# Patient Record
Sex: Male | Born: 1950 | Race: White | Hispanic: No | Marital: Single
Health system: Midwestern US, Academic
[De-identification: ages and names within clinical notes are randomized; demographics above are authoritative.]

---

## 2021-07-04 IMAGING — MR Head^Brain
2 series · 48 of 48 positions shown · non-contrast
Comparison: none

[Series 4: cow · axial · 0.8mm · 0.56mm/px · z∈[-71,-0]mm · 46 of 90 slices shown]
[im 1/90]
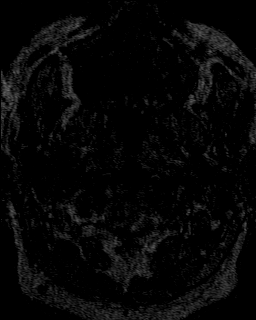
[im 2/90]
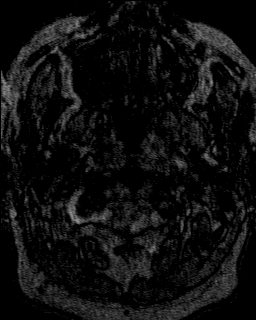
[im 4/90]
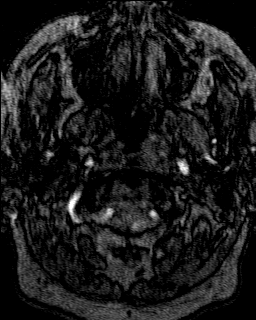
[im 6/90]
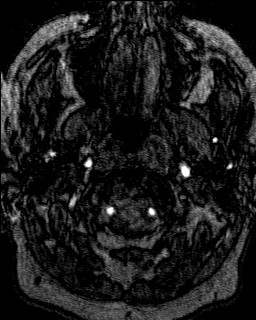
[im 8/90]
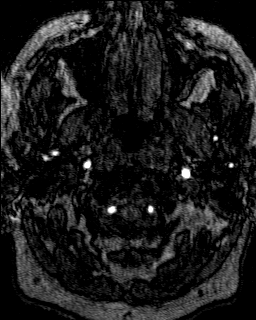
[im 10/90]
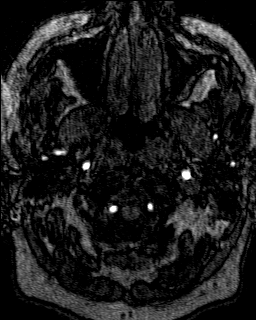
[im 12/90]
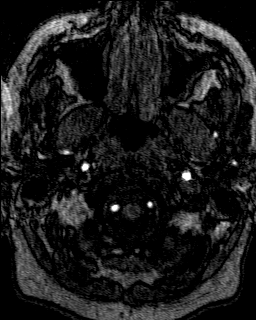
[im 14/90]
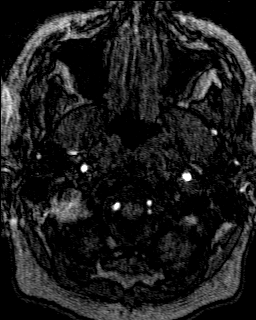
[im 16/90]
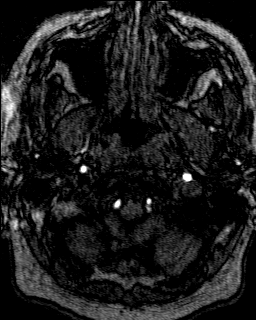
[im 18/90]
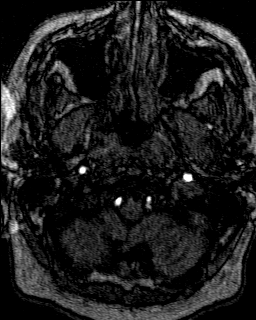
[im 20/90]
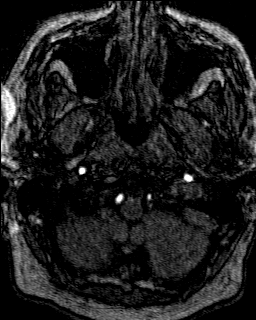
[im 22/90]
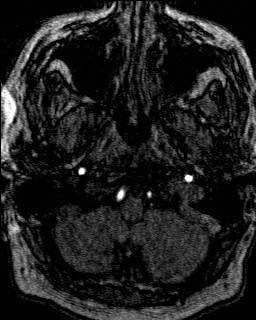
[im 24/90]
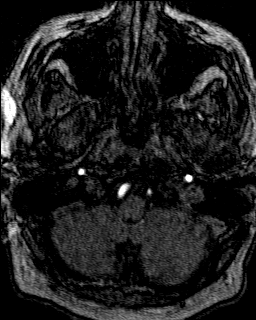
[im 26/90]
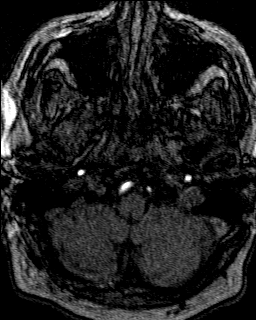
[im 28/90]
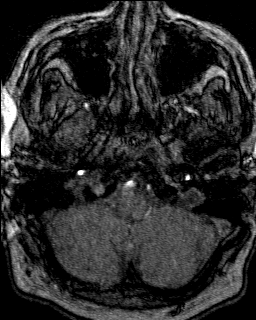
[im 30/90]
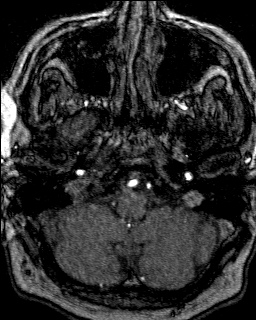
[im 32/90]
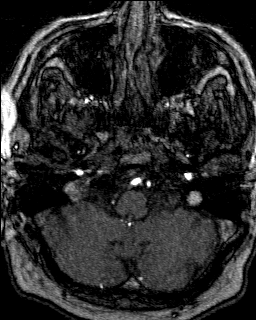
[im 34/90]
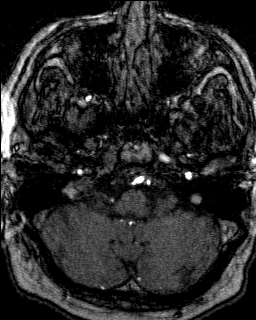
[im 36/90]
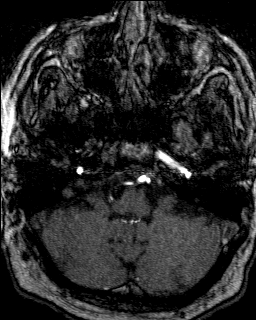
[im 38/90]
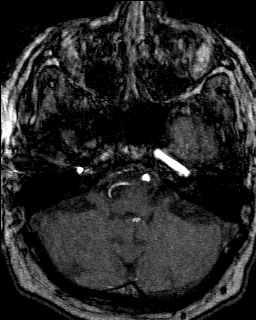
[im 40/90]
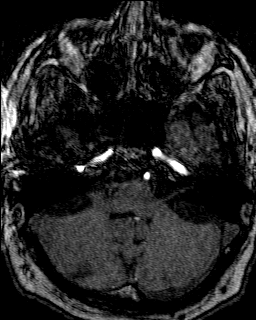
[im 42/90]
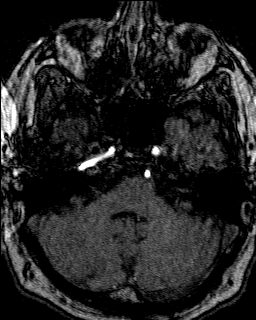
[im 44/90]
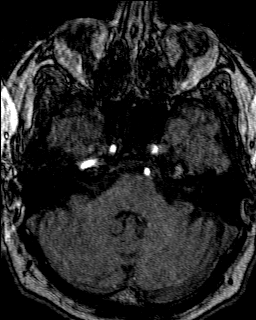
[im 46/90]
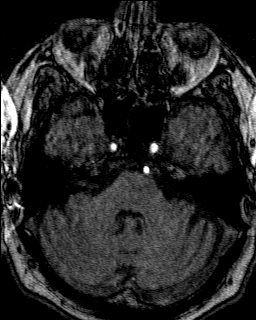
[im 48/90]
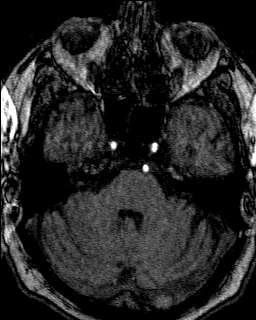
[im 50/90]
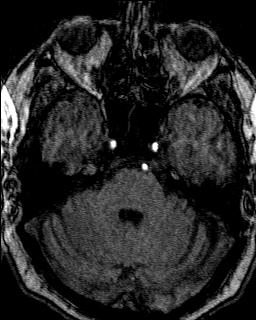
[im 52/90]
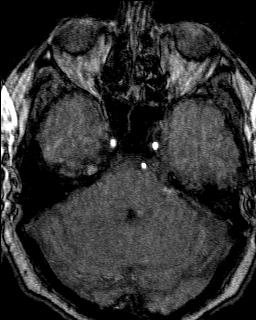
[im 54/90]
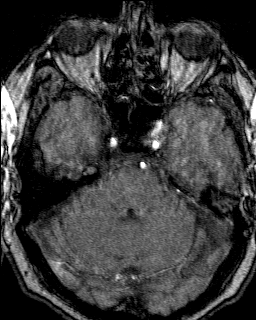
[im 56/90]
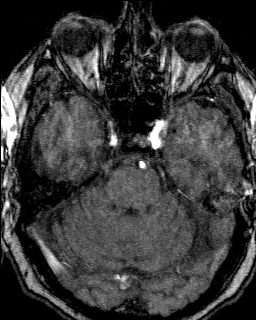
[im 58/90]
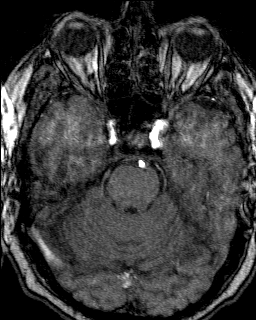
[im 60/90]
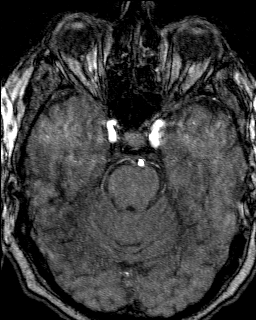
[im 62/90]
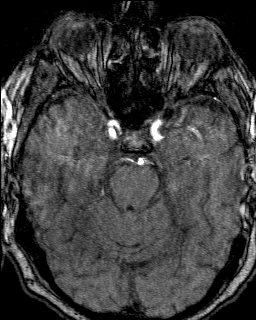
[im 64/90]
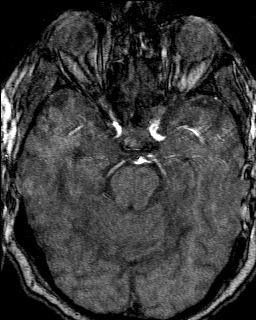
[im 66/90]
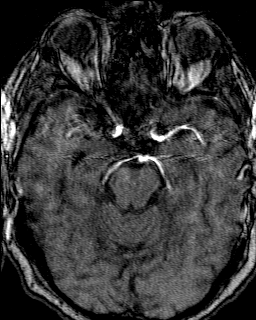
[im 68/90]
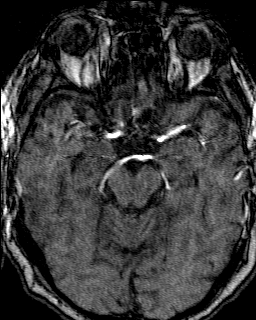
[im 70/90]
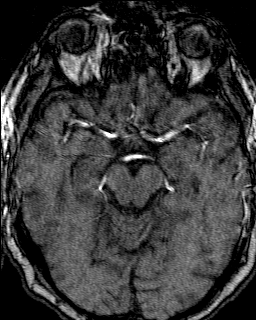
[im 72/90]
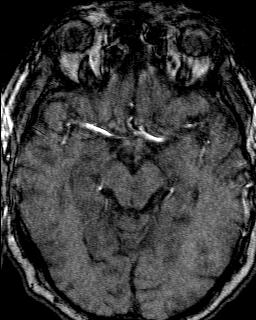
[im 74/90]
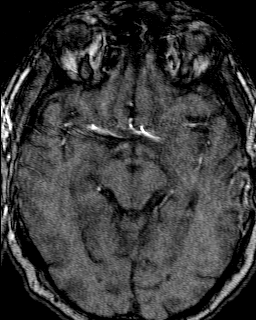
[im 76/90]
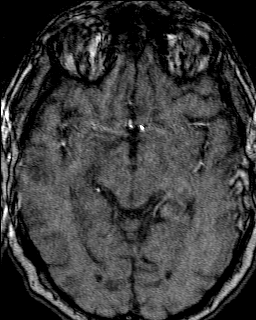
[im 78/90]
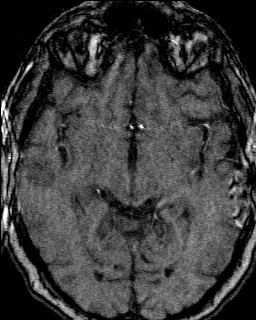
[im 80/90]
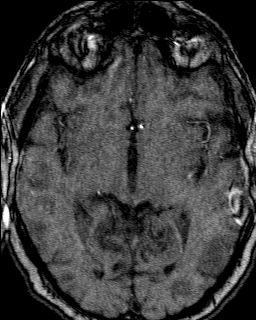
[im 82/90]
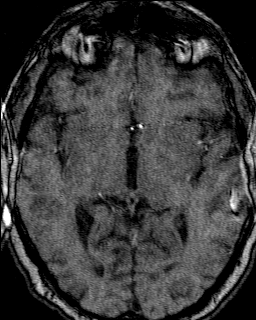
[im 84/90]
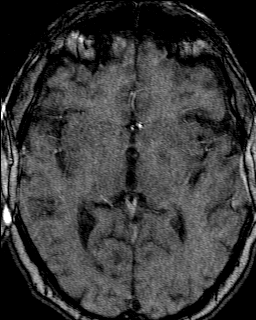
[im 86/90]
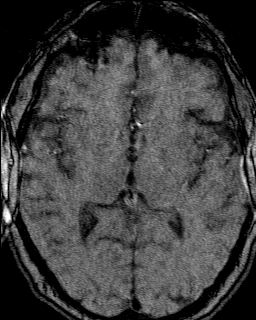
[im 88/90]
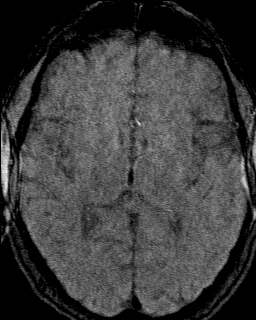
[im 90/90]
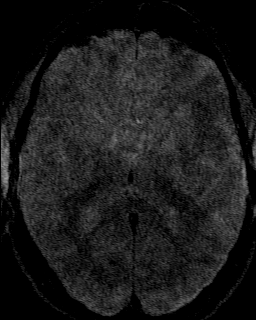

[Series 11: 180 tumble h · axial · 0.56mm/px · z∈[-78,-33]mm · 2 of 3 slices shown]
[im 1/3]
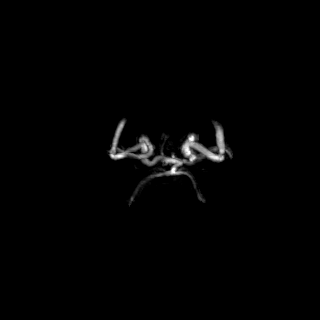
[im 3/3]
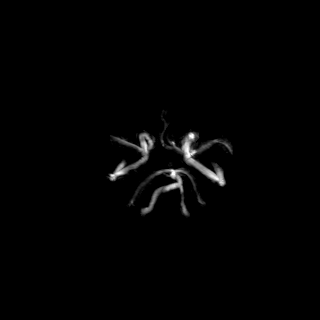

[48 of 48 positions shown; findings below may reference images not displayed]

07/04/21

DIAGNOSTIC STUDIES

EXAM

MRI of the brain without contrast MR angiography of the brain.

INDICATION

R sided weakness
RIGHT SIDED WEAKNESS, FACIAL NUMBNESS, DIFFICULTY GETTING WORDS OUT, UNABLE TO WRITE WITH RT HAND,
STARTING YESTERDAY.  PT HAD DIFFICULTY HOLDING STILL
DUE TO PAIN AND "CRAMPING IN LEGS", TRIED WALKING PATIENT, REPOSITIONI(..)

TECHNIQUE

Sagittal, axial, and coronal images were obtained with variable T1 and T2 weighting. 3D
time-of-flight MR angiography through the circle Willis was obtained.

COMPARISONS

None available

FINDINGS

There are no abnormal signal properties of the brainstem or visualized cervical cord. The expected
signal voids are noted throughout the major intracranial vascular structures and mastoid air cells.
Mucosal thickening of the paranasal sinuses is evident.

There is cortical atrophy and small vessel ischemic disease with scattered foci of T2 signal within
the supratentorial white matter. There is a focus of diffusion restriction left pons measuring
by 0.8 cm image 49 series 5 with corresponding ADC match consistent with acute ischemia. No
intracranial mass or hemorrhage is noted.

Exam is slightly limited due to patient motion.

3D time-of-flight demonstrates normal flow throughout the vertebral bodies are system with dominant
right vertebral artery. Normal flow in the cavernous arteries is seen. No aneurysm or vascular
malformation is noted. The posterior communicating arteries are not visualized and may be
developmentally absent.

IMPRESSION

Acute left pontine infarct.

No intracranial mass or hemorrhage. Underlying cortical atrophy and small vessel ischemic disease
is evident. Report called to Dr. Sungja at [DATE] p.m..

Tech Notes:

RIGHT SIDED WEAKNESS, FACIAL NUMBNESS, DIFFICULTY GETTING WORDS OUT, UNABLE TO WRITE WITH RT HAND,
STARTING YESTERDAY.  PT HAD DIFFICULTY HOLDING STILL DUE TO PAIN AND "CRAMPING IN LEGS", TRIED
WALKING PATIENT, REPOSITIONING, RESTRAINING, AND RESTING PATIENT, ALL WITH DISCOMFORT AND MOTION.
RG

## 2021-07-04 IMAGING — MR Head^Brain
8 series · 38 of 48 positions shown · non-contrast
Comparison: none

[Series 4: T1 · sagittal · 5.0mm · 0.45mm/px · 4 of 20 slices shown (1 of 2)]
[im 1/20]
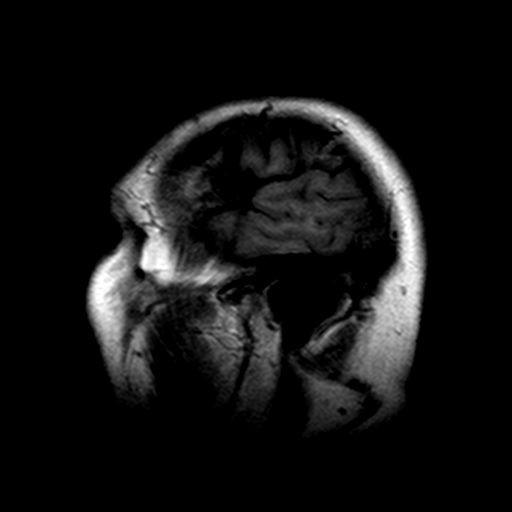
[im 7/20]
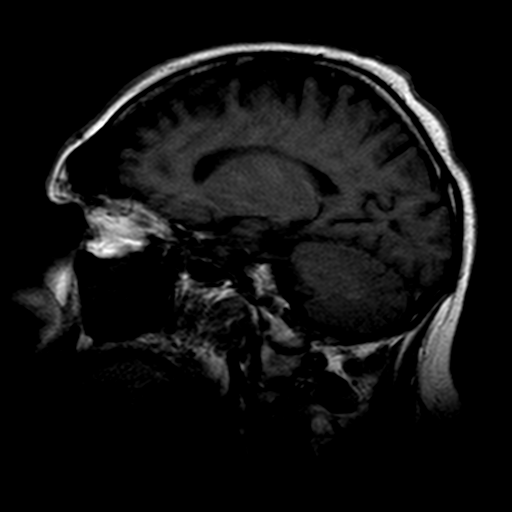
[im 13/20]
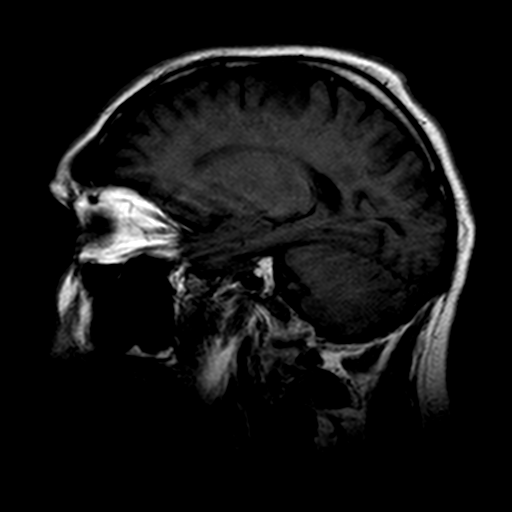
[im 20/20]
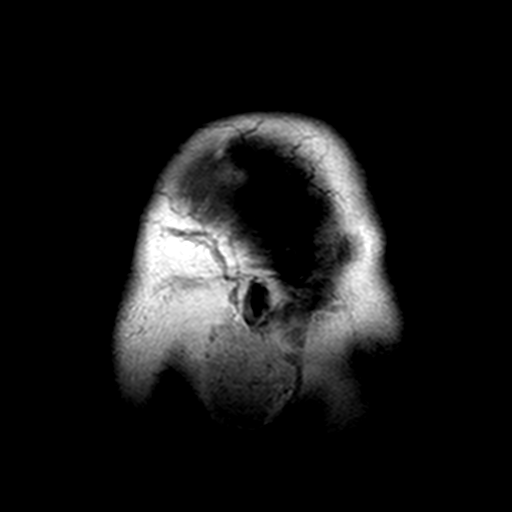

[Series 5: DWI · axial · 5.0mm · 1.80mm/px · z∈[-68,+60]mm · 8 of 63 slices shown (1 of 2)]
[im 1/63]
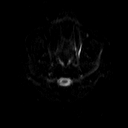
[im 10/63]
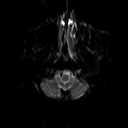
[im 20/63]
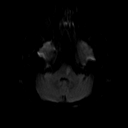
[im 29/63]
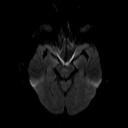
[im 34/63]
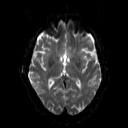
[im 43/63]
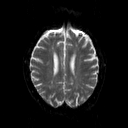
[im 53/63]
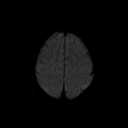
[im 63/63]
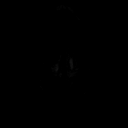

[Series 6: DWI · axial · 5.0mm · 1.80mm/px · z∈[-68,+60]mm · 5 of 21 slices shown (2 of 2)]
[im 1/21]
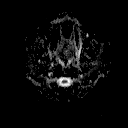
[im 6/21]
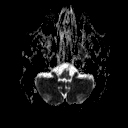
[im 11/21]
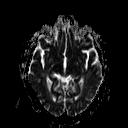
[im 16/21]
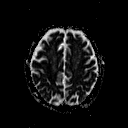
[im 21/21]
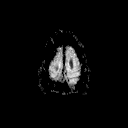

[Series 11: FLAIR · axial · 5.0mm · 0.45mm/px · z∈[-67,+79]mm · 5 of 24 slices shown]
[im 1/24]
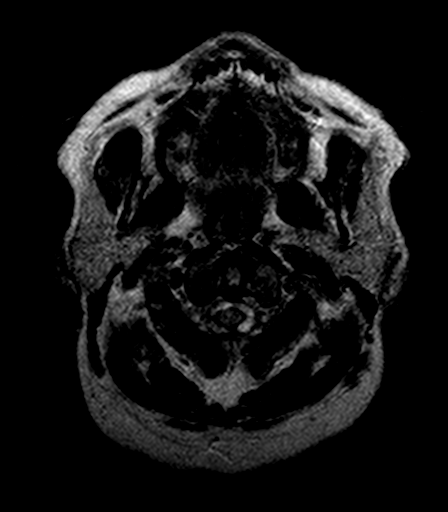
[im 6/24]
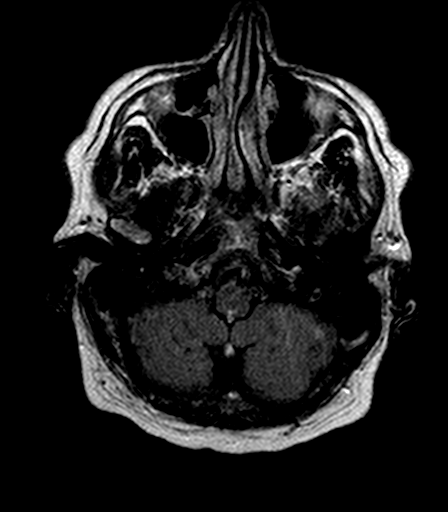
[im 12/24]
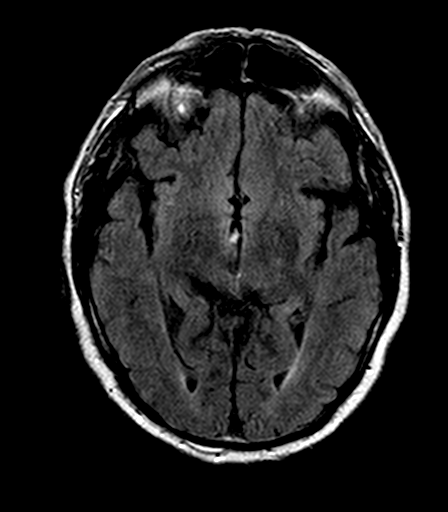
[im 18/24]
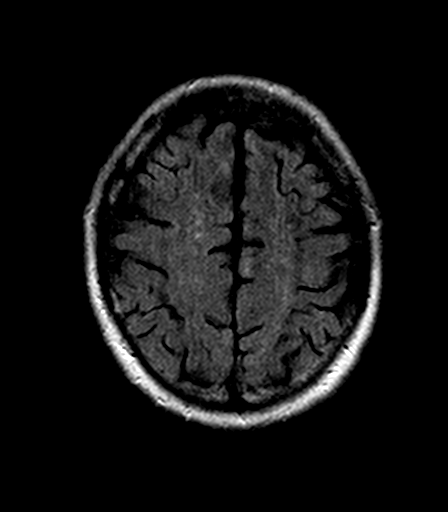
[im 24/24]
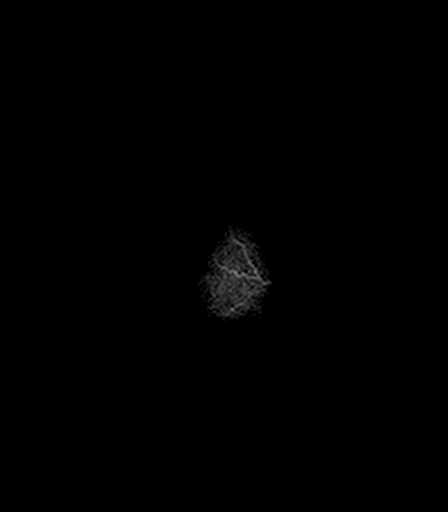

[Series 12: T2 · axial · 5.0mm · 0.72mm/px · z∈[-67,+79]mm · 5 of 24 slices shown (1 of 2)]
[im 1/24]
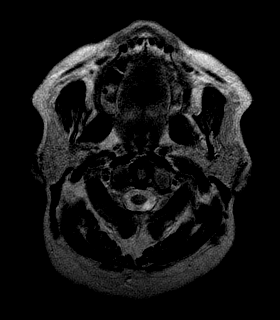
[im 6/24]
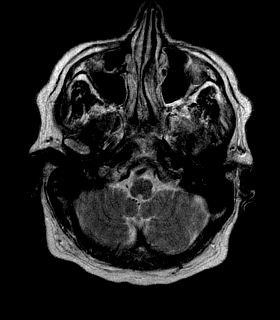
[im 12/24]
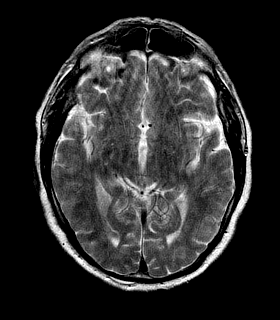
[im 18/24]
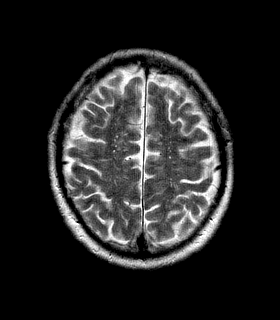
[im 24/24]
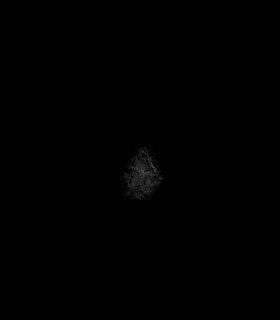

[Series 13: T1 · axial · 5.0mm · 0.45mm/px · z∈[-67,+79]mm · 5 of 23 slices shown (2 of 2)]
[im 1/23]
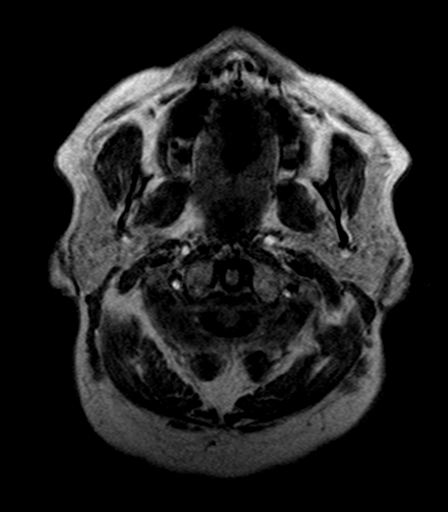
[im 6/23]
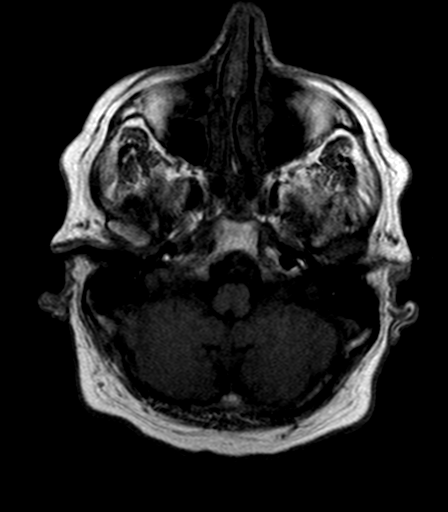
[im 12/23]
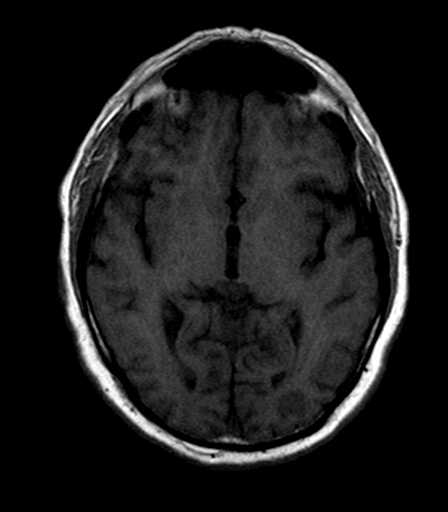
[im 17/23]
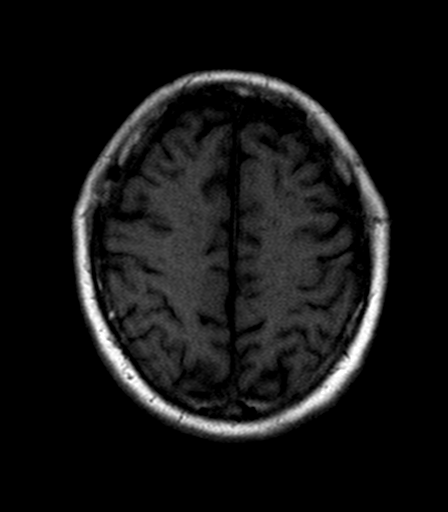
[im 23/23]
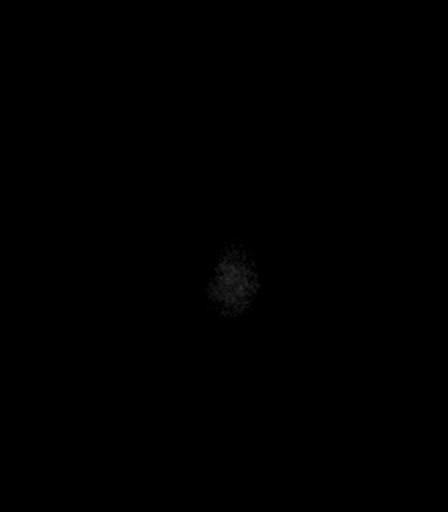

[Series 14: axial blood · axial · 5.0mm · 0.45mm/px · 1 of 23 slices shown]
[im 1/23]
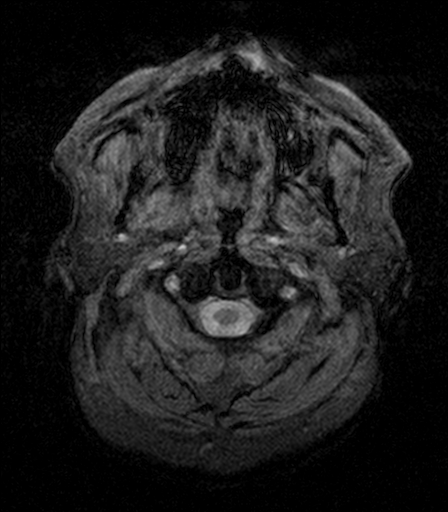

[Series 15: T2 · coronal · 5.0mm · 0.69mm/px · 5 of 25 slices shown (2 of 2)]
[im 1/25]
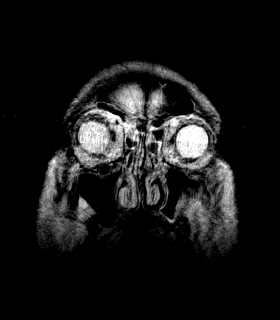
[im 7/25]
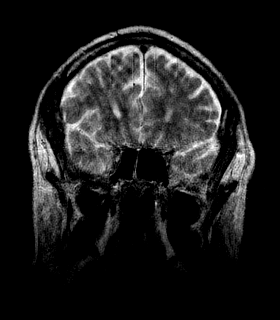
[im 13/25]
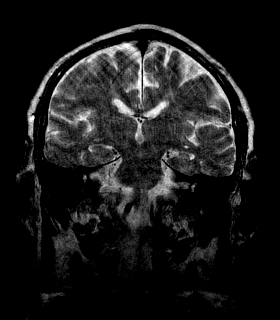
[im 19/25]
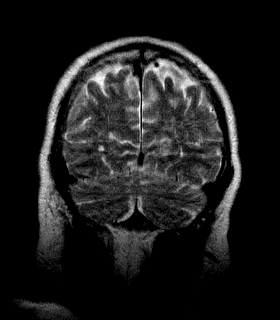
[im 25/25]
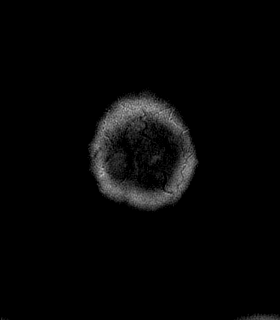

[38 of 48 positions shown; findings below may reference images not displayed]

07/04/21

DIAGNOSTIC STUDIES

EXAM

MRI of the brain without contrast MR angiography of the brain.

INDICATION

R sided weakness
RIGHT SIDED WEAKNESS, FACIAL NUMBNESS, DIFFICULTY GETTING WORDS OUT, UNABLE TO WRITE WITH RT HAND,
STARTING YESTERDAY.  PT HAD DIFFICULTY HOLDING STILL
DUE TO PAIN AND "CRAMPING IN LEGS", TRIED WALKING PATIENT, REPOSITIONI(..)

TECHNIQUE

Sagittal, axial, and coronal images were obtained with variable T1 and T2 weighting. 3D
time-of-flight MR angiography through the circle Willis was obtained.

COMPARISONS

None available

FINDINGS

There are no abnormal signal properties of the brainstem or visualized cervical cord. The expected
signal voids are noted throughout the major intracranial vascular structures and mastoid air cells.
Mucosal thickening of the paranasal sinuses is evident.

There is cortical atrophy and small vessel ischemic disease with scattered foci of T2 signal within
the supratentorial white matter. There is a focus of diffusion restriction left pons measuring
by 0.8 cm image 49 series 5 with corresponding ADC match consistent with acute ischemia. No
intracranial mass or hemorrhage is noted.

Exam is slightly limited due to patient motion.

3D time-of-flight demonstrates normal flow throughout the vertebral bodies are system with dominant
right vertebral artery. Normal flow in the cavernous arteries is seen. No aneurysm or vascular
malformation is noted. The posterior communicating arteries are not visualized and may be
developmentally absent.

IMPRESSION

Acute left pontine infarct.

No intracranial mass or hemorrhage. Underlying cortical atrophy and small vessel ischemic disease
is evident. Report called to Dr. Sungja at [DATE] p.m..

Tech Notes:

RIGHT SIDED WEAKNESS, FACIAL NUMBNESS, DIFFICULTY GETTING WORDS OUT, UNABLE TO WRITE WITH RT HAND,
STARTING YESTERDAY.  PT HAD DIFFICULTY HOLDING STILL DUE TO PAIN AND "CRAMPING IN LEGS", TRIED
WALKING PATIENT, REPOSITIONING, RESTRAINING, AND RESTING PATIENT, ALL WITH DISCOMFORT AND MOTION.
RG

## 2021-07-04 IMAGING — CT BRAIN WO(Adult)
2 of 4 series · 13 of 47 positions shown, 16 images · non-contrast
Comparison: none

[Series 4: brain cor 5.00 hr40 s3 · coronal · 0.28mm/px · 3 of 41 slices shown]
[im 14/41  brain]
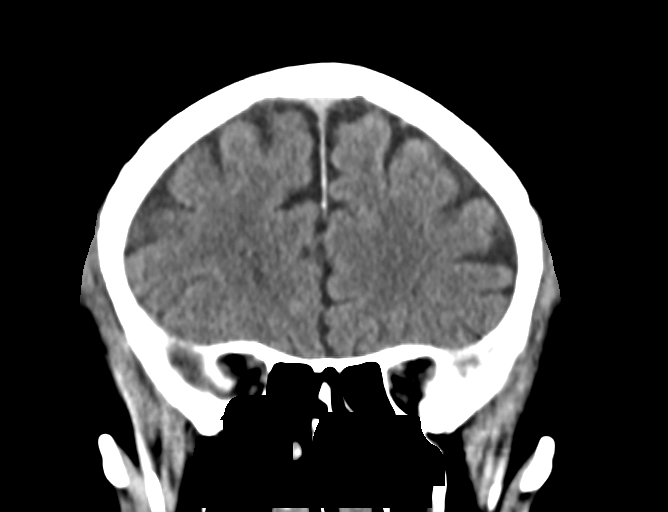
[im 18/41  brain]
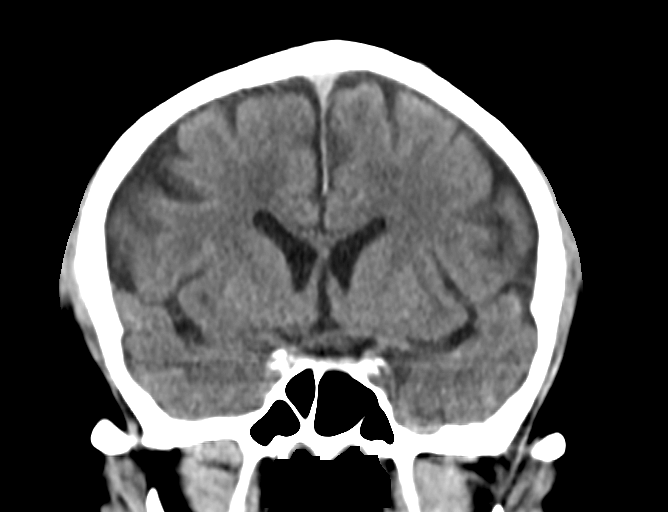
[im 23/41  brain]
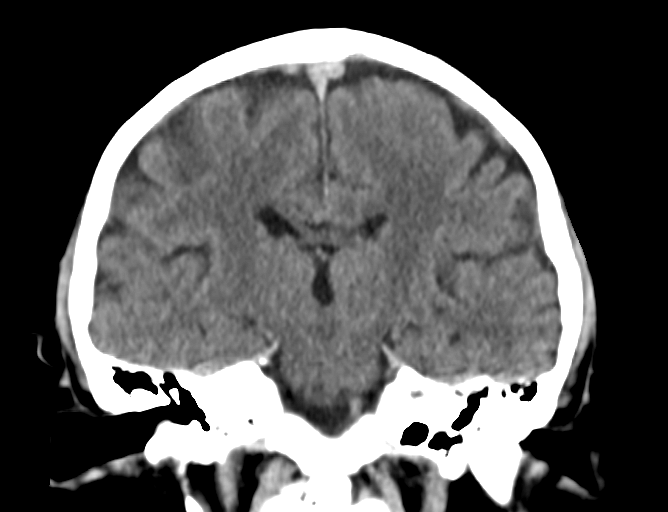

[Series 8: brain ax 2.00 hr60 s3 · axial · 0.36mm/px · z∈[-523,-409]mm · 10 of 71 slices shown, 13 images]
[im 7/71  brain]
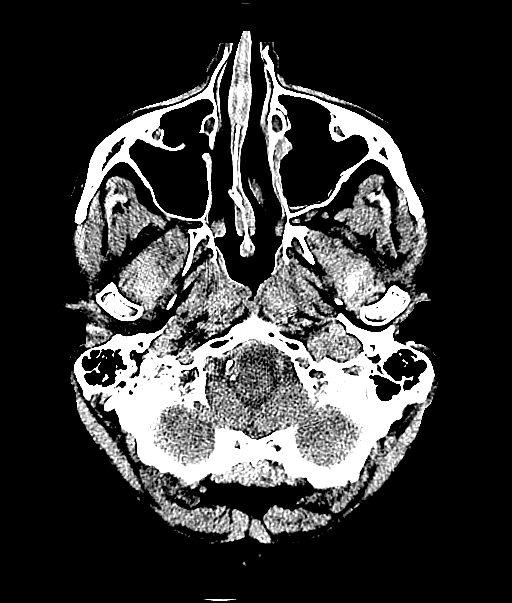
[im 7/71  bone]
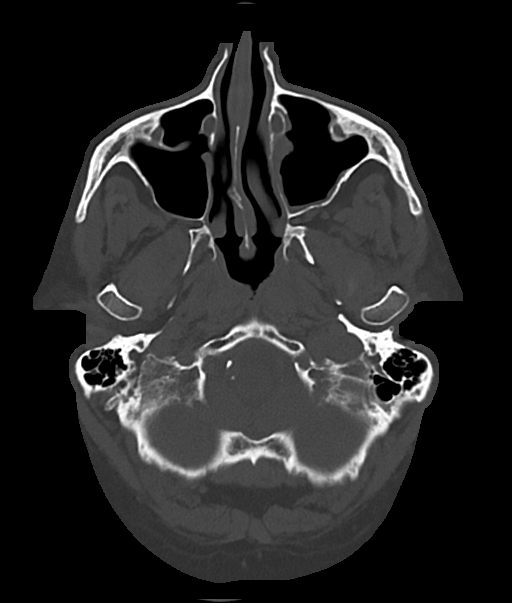
[im 13/71  brain]
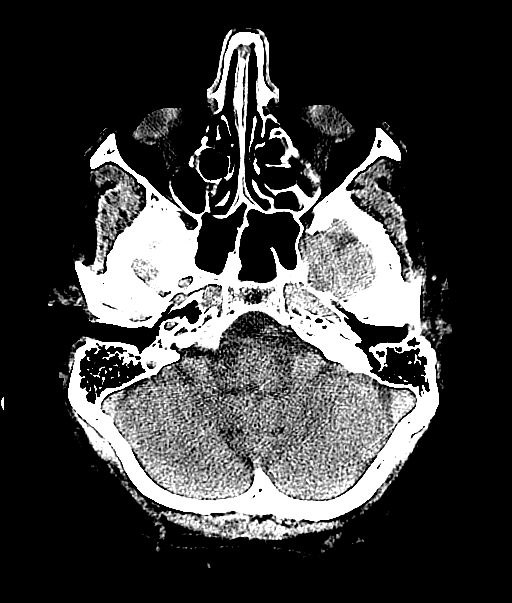
[im 20/71  brain]
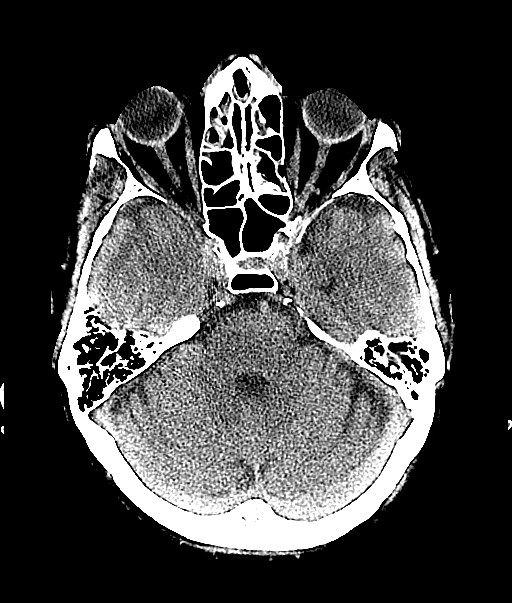
[im 26/71  brain]
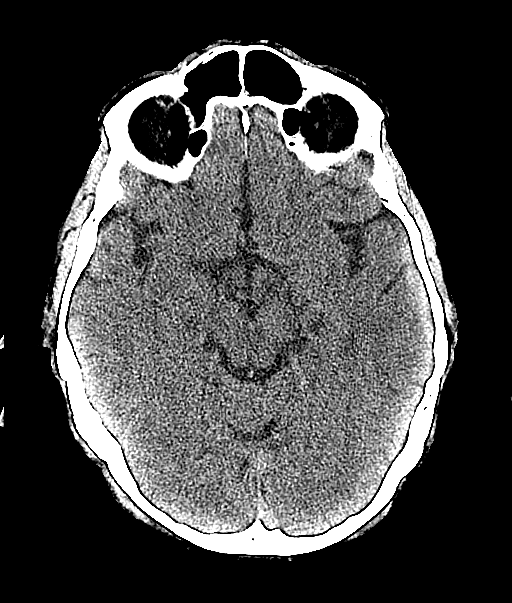
[im 32/71  brain]
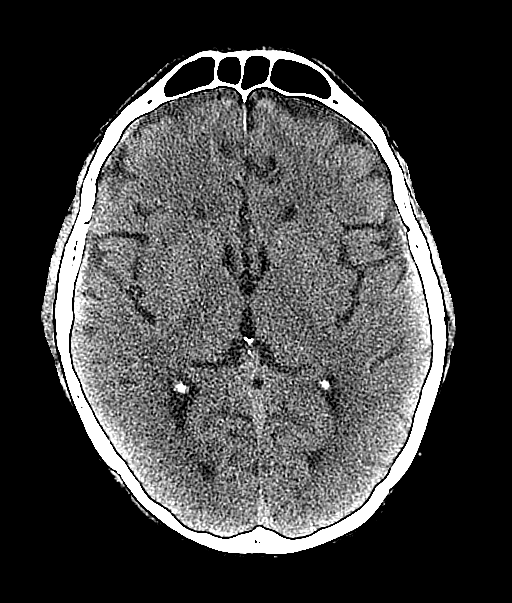
[im 32/71  bone]
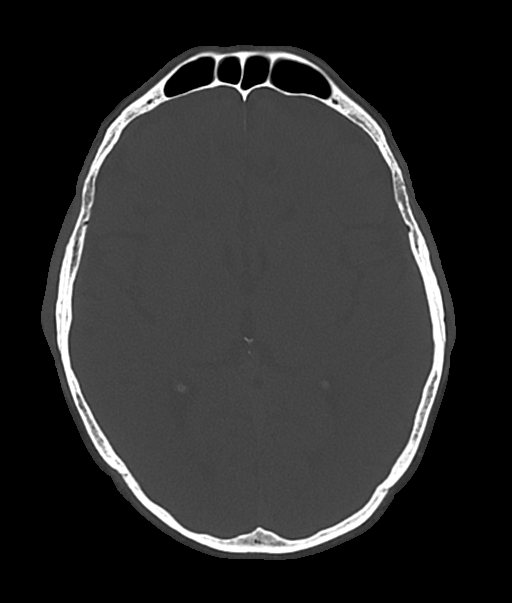
[im 39/71  brain]
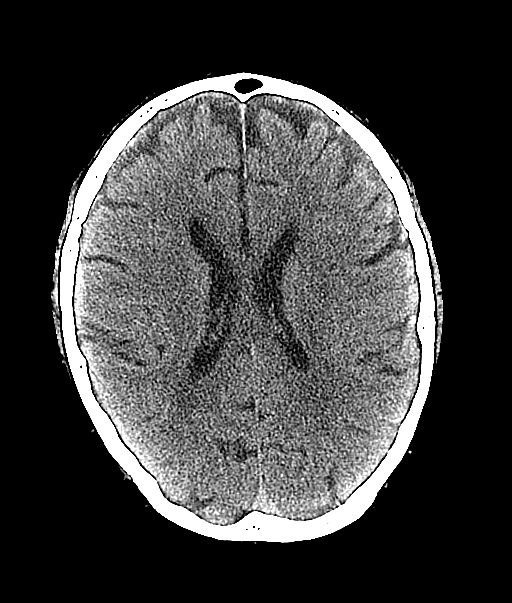
[im 45/71  brain]
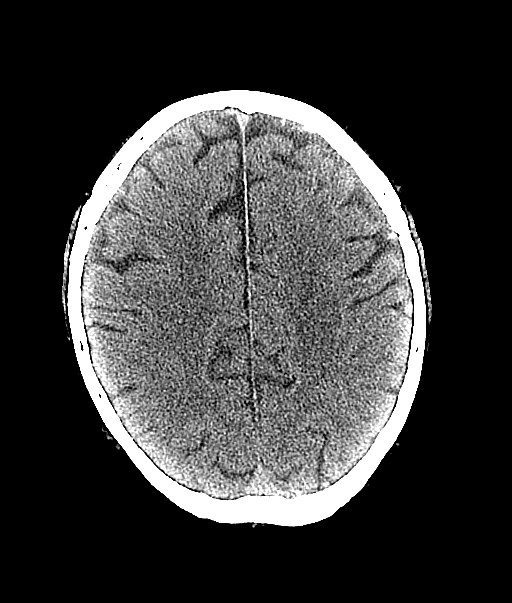
[im 51/71  brain]
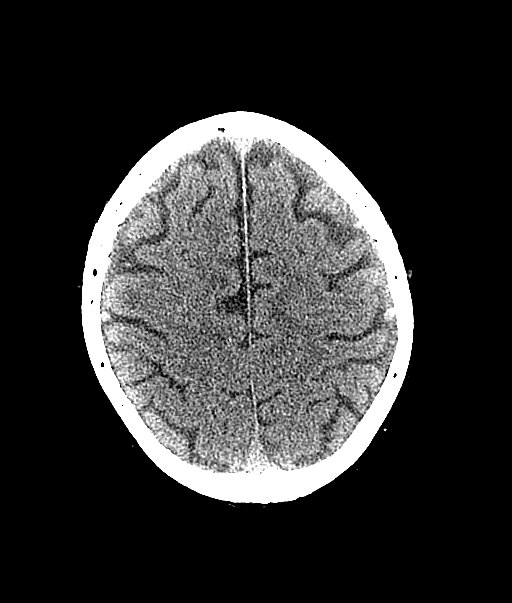
[im 58/71  brain]
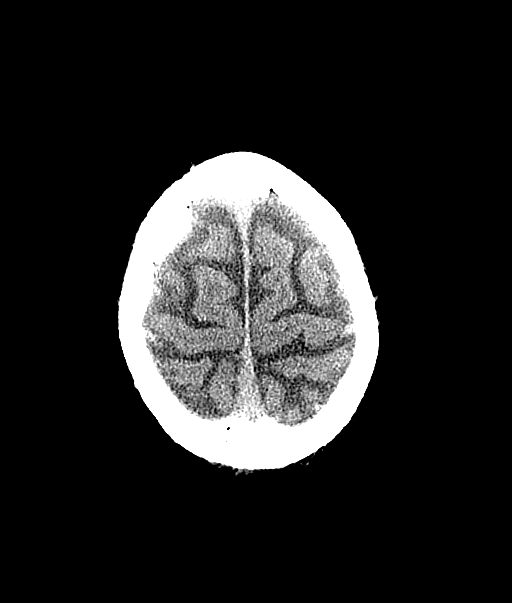
[im 58/71  bone]
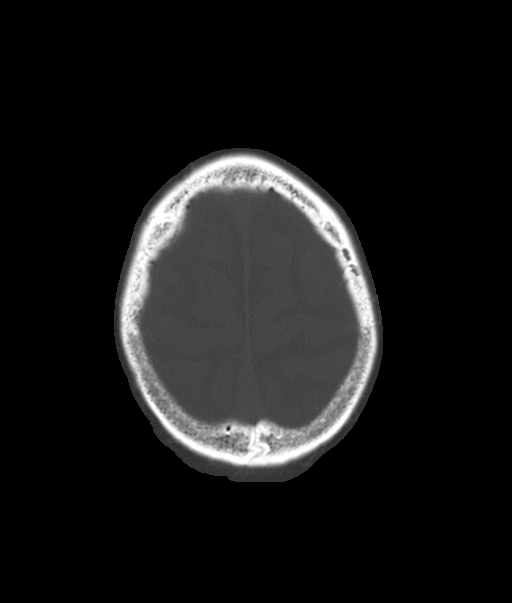
[im 64/71  brain]
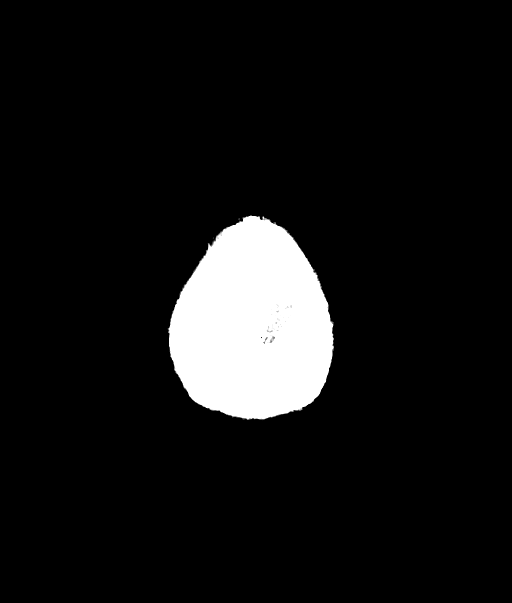

[13 of 47 positions shown; findings below may reference images not displayed]

07/04/21

EXAM

Head CT.

INDICATION

R side weakness since 2pm yesterday...progressing
PT STATES RIGHT SIDE WEAKNESS, TINGLING LIPS. DIFFICULTY WITH SPEECH X YESTERDAY AFTERNOON. CT/NM

TECHNIQUE

Noncontrast CT scan of the head. Coronal and sagittal reformations. No prior CT scans or myocardial
perfusion scans in the past year. All CT scans at this facility use dose modulation, iterative
reconstruction, and/or weight based dosing when appropriate to reduce radiation dose to as low as
reasonably achievable.

COMPARISONS

None

FINDINGS

There is no intra-axial or extra-axial hemorrhage. Low attenuation in the centrum semiovale
suggests chronic small vessel ischemic change. No midline shift or mass effect. The lateral
ventricles are symmetric in size and are age appropriate, though other extra-axial CSF spaces are
mildly prominent suggesting volume loss. Gray-white differentiation is otherwise preserved with
normal appearance of the insular ribbon bilaterally. There is mucosal thickening of the left greater
than right maxillary sinus with mucosal thickening also noted throughout the ethmoid air cells, more
pronounced anteriorly. Mucosal thickening extends to the inferior frontal sinuses, worse on the
right. There is minimal mucosal thickening of the sphenoid sinuses. The mastoid air cells are clear.
The orbits are symmetric. No postseptal or retrobulbar are edema or hemorrhage is evident.

IMPRESSION

No evidence of acute intracranial pathology. Paranasal sinusitis.

Tech Notes:

PT STATES RIGHT SIDE WEAKNESS, TINGLING LIPS. DIFFICULTY WITH SPEECH X YESTERDAY AFTERNOON. CT/NM

## 2021-07-12 ENCOUNTER — Encounter: Admit: 2021-07-12 | Discharge: 2021-07-12

## 2021-07-12 ENCOUNTER — Inpatient Hospital Stay: Admit: 2021-07-12 | Discharge: 2021-07-12

## 2021-07-12 DIAGNOSIS — K611 Rectal abscess: Secondary | ICD-10-CM

## 2021-07-12 DIAGNOSIS — M7989 Other specified soft tissue disorders: Secondary | ICD-10-CM

## 2021-07-12 LAB — COMPREHENSIVE METABOLIC PANEL
ALK PHOSPHATASE: 87 U/L — ABNORMAL HIGH (ref 25–110)
GLUCOSE,PANEL: 93 mg/dL (ref 70–100)
POTASSIUM: 5.5 MMOL/L — ABNORMAL HIGH (ref 3.5–5.1)
SODIUM: 138 MMOL/L (ref 137–147)
TOTAL BILIRUBIN: 1 mg/dL (ref 0.3–1.2)

## 2021-07-12 LAB — CBC
HEMATOCRIT: 46 % (ref 40–50)
MPV: 7.9 FL — ABNORMAL HIGH (ref 7–11)
RBC COUNT: 5 M/UL (ref 4.4–5.5)
WBC COUNT: 11 K/UL — ABNORMAL HIGH (ref 4.5–11.0)

## 2021-07-12 LAB — MAGNESIUM: MAGNESIUM: 2.1 mg/dL (ref 1.6–2.6)

## 2021-07-12 LAB — LACTIC ACID(LACTATE): LACTIC ACID: 0.9 MMOL/L (ref 0.5–2.0)

## 2021-07-12 IMAGING — CT PELVIS_W(Adult)
2 of 3 series · 12 of 36 positions shown, 14 images · non-contrast
Comparison: none

[Series 6: pelvis sag 5.00 br40 s3 · sagittal · 0.58mm/px · 1 of 79 slices shown]
[im 27/79  soft-tissue]
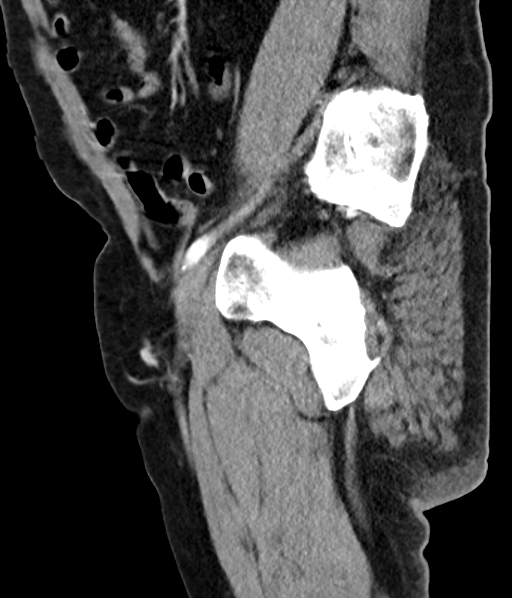

[Series 8: pelvis ax 5.00 br60 s3 · axial · 0.58mm/px · z∈[+1300,+1605]mm · 11 of 67 slices shown, 13 images]
[im 5/67  soft-tissue]
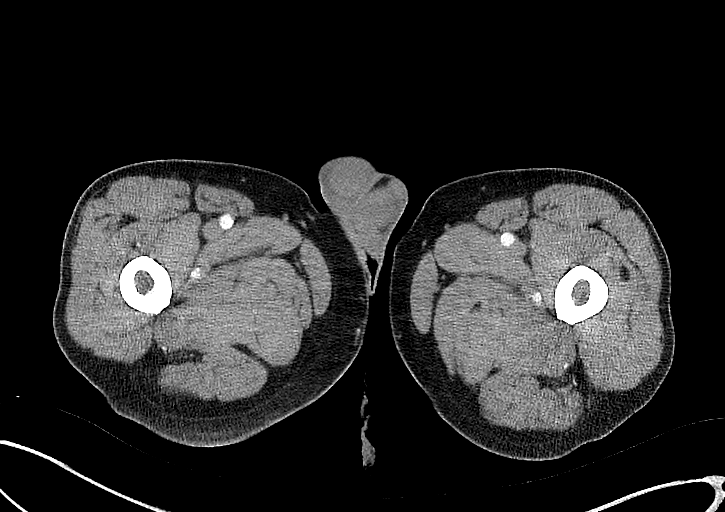
[im 5/67  bone]
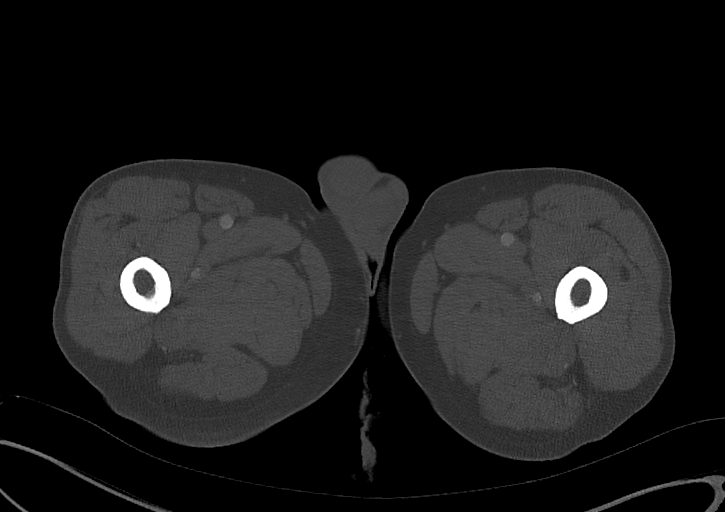
[im 14/67  soft-tissue]
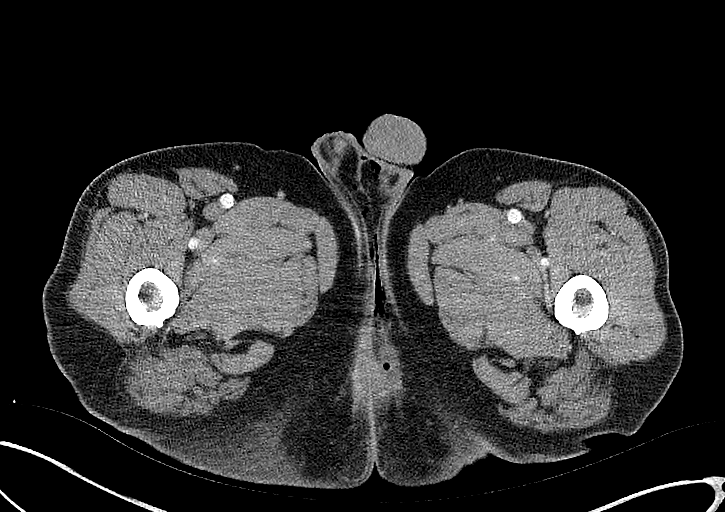
[im 23/67  soft-tissue]
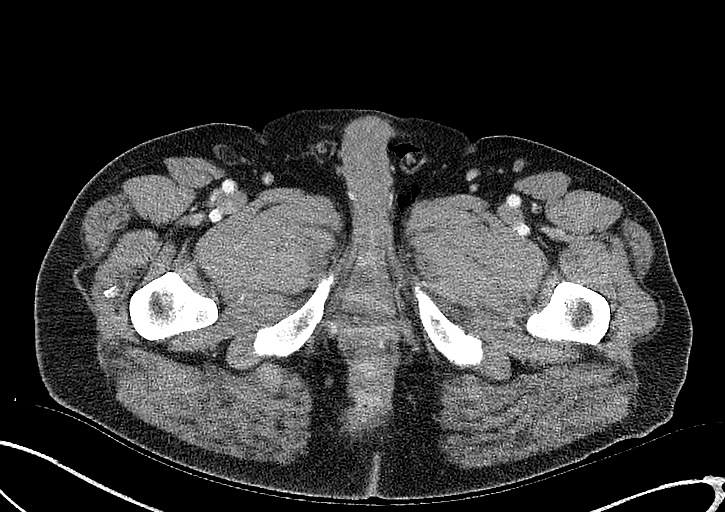
[im 29/67  soft-tissue]
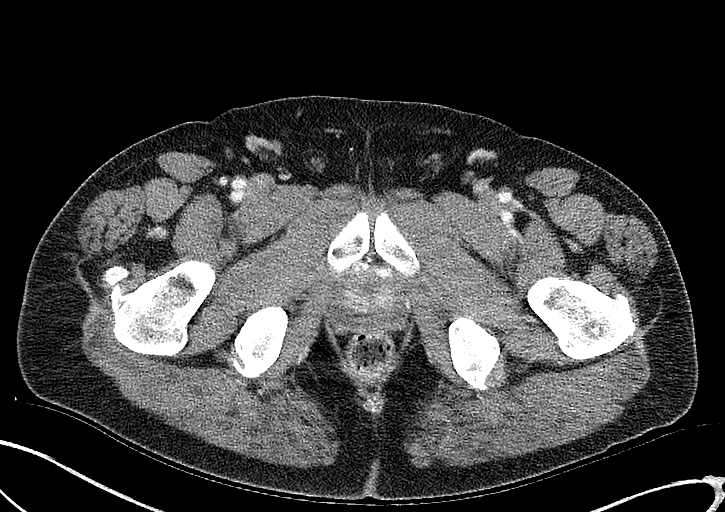
[im 38/67  soft-tissue]
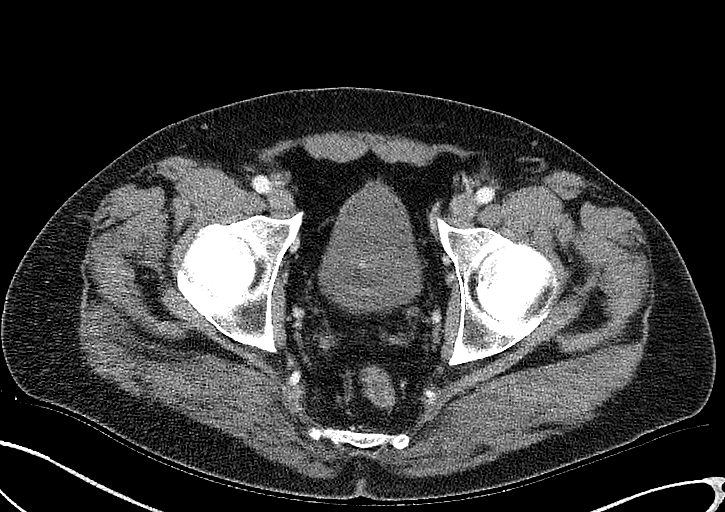
[im 45/67  soft-tissue]
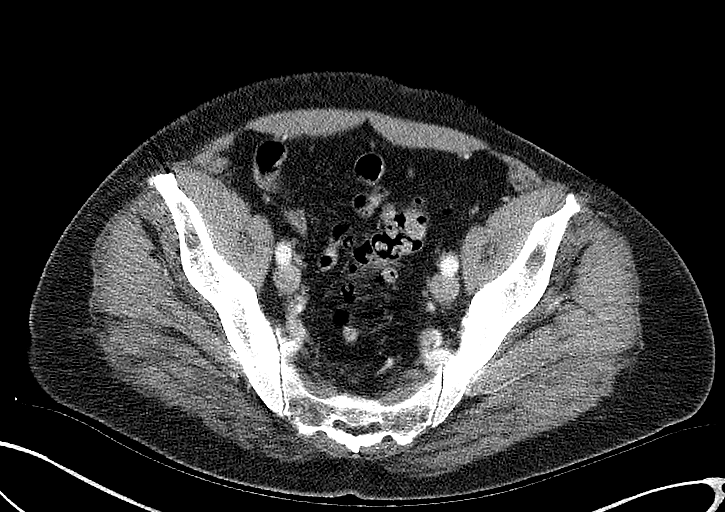
[im 53/67  soft-tissue]
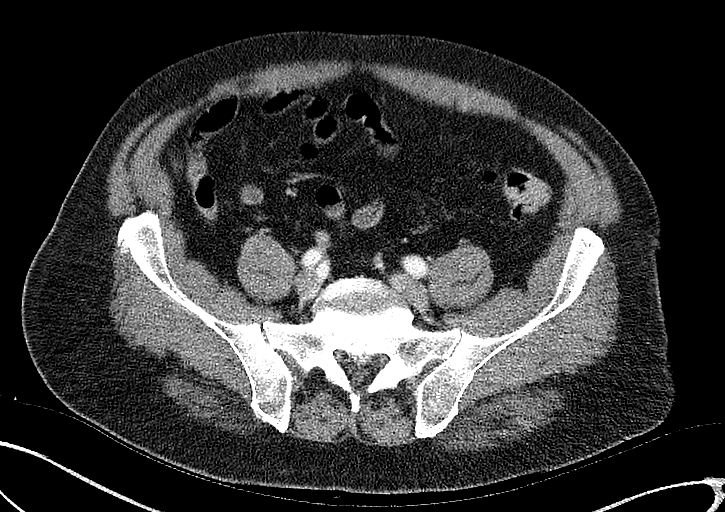
[im 58/67  lung]
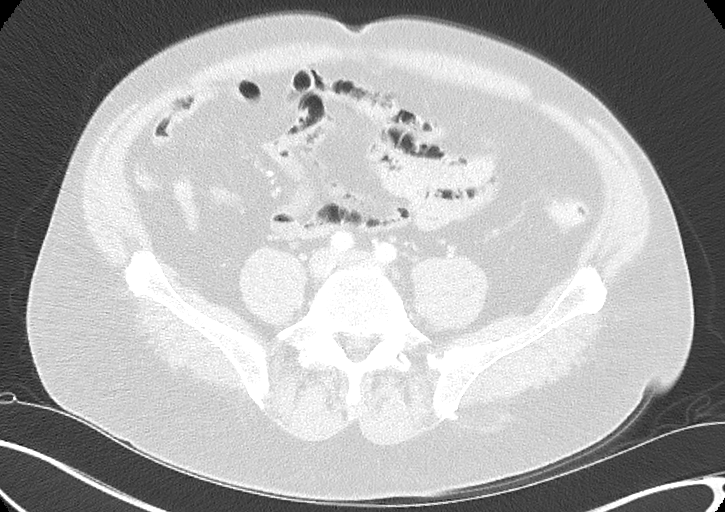
[im 60/67  lung]
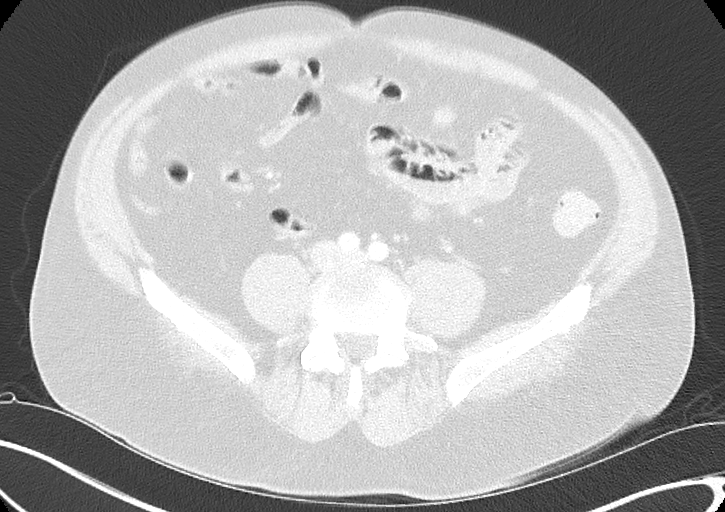
[im 62/67  soft-tissue]
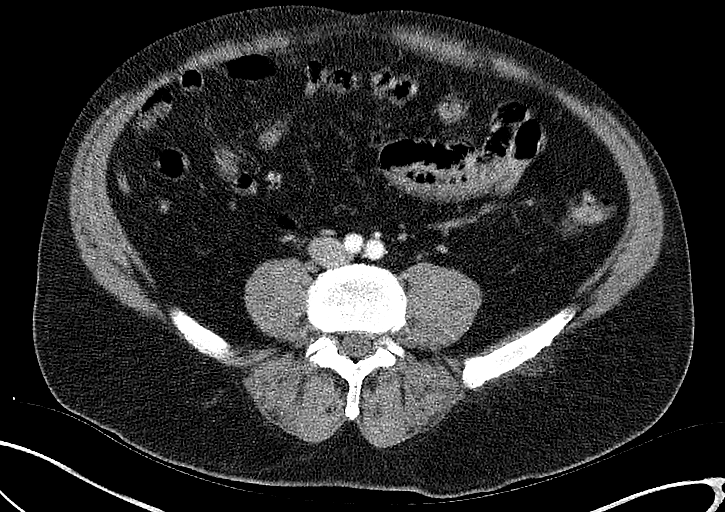
[im 62/67  lung]
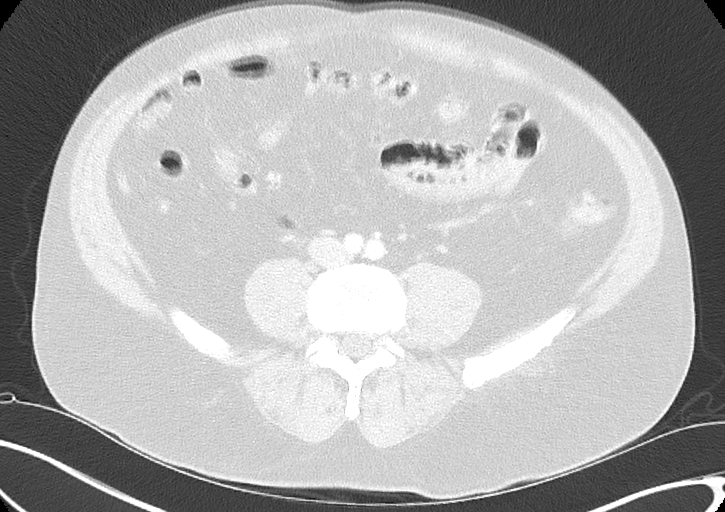
[im 64/67  lung]
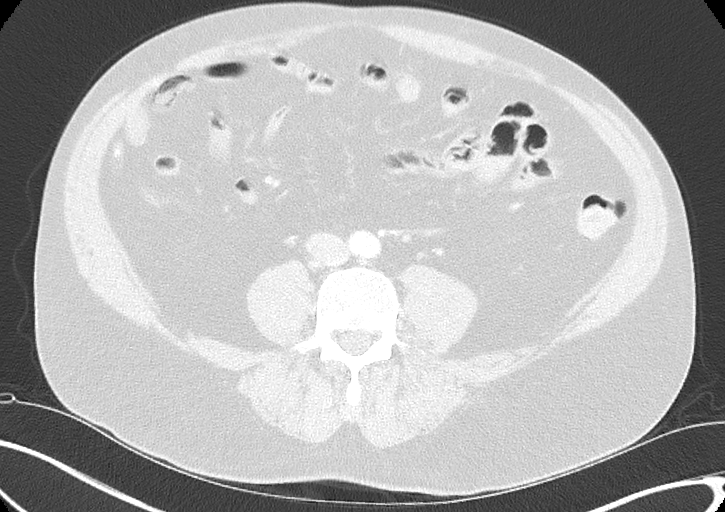

[12 of 36 positions shown; findings below may reference images not displayed]

07/12/21

DIAGNOSTIC STUDIES

EXAM

CT pelvis with contrast

INDICATION

perianal abscess
PERIANAL ABSCESS. GFR 75. 9YYE7 75O6DYY. CT/NM 1/0. TJ

TECHNIQUE

All CT scans at this facility use dose modulation, iterative reconstruction, and/or weight based
dosing when appropriate to reduce radiation dose to as low as reasonably achievable.

Number of previous computed tomography exams in the last 12 months is 1.

Number of previous nuclear medicine myocardial perfusion studies in the last 12 months is 0  .

COMPARISONS

None available

FINDINGS

There is extensive soft tissue gas throughout the perineum extending into the scrotum and base of
the penis greater on the left than right. This does extend across the midline. Within the inferior
left gluteal fold below the anal verge there is a 2.2 x 1.7 cm small gas fluid collection consistent
with abscess. Additional area of induration and possible tiny abscess can be seen at 7 o'clock in
the right ischial anal fossa image 49 series 2.

There is colonic diverticulosis without diverticulitis. Appendix is normal. No adenopathy is seen
throughout the pelvis. No free air is seen within the visualized intraperitoneal pelvis.
Degenerative changes of the spine are noted.

IMPRESSION

Findings concerning for Karanja gangrene with small left inferior gluteal fold abscess and
possible small additional abscess in the right ischial anal fossa. Report called to Vienos Pokylis Alsyte
at [DATE] p.m..

Tech Notes:

PERIANAL ABSCESS. GFR 75. 9YYE7 75O6DYY. CT/NM 1/0. TJ

## 2021-07-12 MED ORDER — DEXTROSE 50 % IN WATER (D50W) IV SYRG
25 g | Freq: Once | INTRAVENOUS | 0 refills | Status: CP
Start: 2021-07-12 — End: ?
  Administered 2021-07-13: 04:00:00 50 mL via INTRAVENOUS

## 2021-07-12 MED ORDER — METRONIDAZOLE IN NACL (ISO-OS) 500 MG/100 ML IV PGBK
500 mg | INTRAVENOUS | 0 refills | Status: AC
Start: 2021-07-12 — End: ?
  Administered 2021-07-13 (×2): 500 mg via INTRAVENOUS

## 2021-07-12 MED ORDER — PIPERACILLIN/TAZOBACTAM 4.5 G/100ML NS IVPB (MB+)
4.5 g | INTRAVENOUS | 0 refills | Status: DC
Start: 2021-07-12 — End: 2021-07-13
  Administered 2021-07-13 (×2): 4.5 g via INTRAVENOUS

## 2021-07-12 MED ORDER — LINEZOLID IN DEXTROSE 5% 600 MG/300 ML IV PGBK
600 mg | Freq: Two times a day (BID) | INTRAVENOUS | 0 refills | Status: AC
Start: 2021-07-12 — End: ?
  Administered 2021-07-13 – 2021-07-14 (×3): 600 mg via INTRAVENOUS

## 2021-07-12 MED ORDER — LACTATED RINGERS IV SOLP
INTRAVENOUS | 0 refills | Status: AC
Start: 2021-07-12 — End: ?
  Administered 2021-07-13 – 2021-07-14 (×3): 1000.000 mL via INTRAVENOUS

## 2021-07-12 MED ORDER — CALCIUM GLUC IN NACL, ISO-OSM 1 GRAM/100 ML IV SOLN
1 g | Freq: Once | INTRAVENOUS | 0 refills | Status: CP
Start: 2021-07-12 — End: ?
  Administered 2021-07-13: 04:00:00 1 g via INTRAVENOUS

## 2021-07-12 MED ORDER — INSULIN REGULAR HUMAN(#) 1 UNIT/ML IJ SYRINGE
10 [IU] | Freq: Once | INTRAVENOUS | 0 refills | Status: CP
Start: 2021-07-12 — End: ?
  Administered 2021-07-13: 04:00:00 10 [IU] via INTRAVENOUS

## 2021-07-12 MED ORDER — CEFEPIME 2G/100ML NS IVPB (MB+)(EXTENDED INFUSION)
2 g | INTRAVENOUS | 0 refills | Status: AC
Start: 2021-07-12 — End: ?
  Administered 2021-07-13 – 2021-07-14 (×8): 2 g via INTRAVENOUS

## 2021-07-12 MED ORDER — METRONIDAZOLE IN NACL (ISO-OS) 500 MG/100 ML IV PGBK
500 mg | Freq: Two times a day (BID) | INTRAVENOUS | 0 refills | Status: DC
Start: 2021-07-12 — End: 2021-07-13

## 2021-07-12 MED ORDER — PIPERACILLIN/TAZOBACTAM 4.5 G/100ML NS IVPB (MB+)
4.5 g | INTRAVENOUS | 0 refills | Status: DC
Start: 2021-07-12 — End: 2021-07-13

## 2021-07-12 MED ORDER — LINEZOLID IN DEXTROSE 5% 600 MG/300 ML IV PGBK
600 mg | Freq: Two times a day (BID) | INTRAVENOUS | 0 refills | Status: DC
Start: 2021-07-12 — End: 2021-07-13

## 2021-07-12 MED ORDER — ENOXAPARIN 40 MG/0.4 ML SC SYRG
40 mg | Freq: Every day | SUBCUTANEOUS | 0 refills | Status: AC
Start: 2021-07-12 — End: ?
  Administered 2021-07-13 – 2021-07-15 (×3): 40 mg via SUBCUTANEOUS

## 2021-07-12 MED ORDER — FENTANYL CITRATE (PF) 50 MCG/ML IJ SOLN
25-50 ug | INTRAVENOUS | 0 refills | Status: AC | PRN
Start: 2021-07-12 — End: ?
  Administered 2021-07-13: 08:00:00 25 ug via INTRAVENOUS
  Administered 2021-07-13 (×2): 50 ug via INTRAVENOUS

## 2021-07-12 MED ORDER — DIPHENHYDRAMINE HCL 50 MG/ML IJ SOLN
25 mg | Freq: Once | INTRAVENOUS | 0 refills | Status: CP
Start: 2021-07-12 — End: ?
  Administered 2021-07-13: 04:00:00 25 mg via INTRAVENOUS

## 2021-07-12 MED ORDER — OXYCODONE 5 MG PO TAB
5-15 mg | ORAL | 0 refills | Status: AC | PRN
Start: 2021-07-12 — End: ?
  Administered 2021-07-13: 08:00:00 10 mg via ORAL
  Administered 2021-07-14: 12:00:00 5 mg via ORAL
  Administered 2021-07-15: 13:00:00 10 mg via ORAL

## 2021-07-12 MED ORDER — FAMOTIDINE (PF) 20 MG/2 ML IV SOLN
20 mg | Freq: Once | INTRAVENOUS | 0 refills | Status: CP
Start: 2021-07-12 — End: ?
  Administered 2021-07-13: 02:00:00 20 mg via INTRAVENOUS

## 2021-07-12 MED ORDER — CEFEPIME 2G/100ML NS IVPB (MB+)
2 g | Freq: Once | INTRAVENOUS | 0 refills | Status: CP
Start: 2021-07-12 — End: ?
  Administered 2021-07-13 (×2): 2 g via INTRAVENOUS

## 2021-07-12 MED ORDER — ONDANSETRON HCL (PF) 4 MG/2 ML IJ SOLN
4 mg | INTRAVENOUS | 0 refills | Status: AC | PRN
Start: 2021-07-12 — End: ?
  Administered 2021-07-15: 02:00:00 4 mg via INTRAVENOUS

## 2021-07-12 MED ORDER — DIPHENHYDRAMINE HCL 50 MG/ML IJ SOLN
25 mg | Freq: Once | INTRAVENOUS | 0 refills | Status: CP
Start: 2021-07-12 — End: ?
  Administered 2021-07-13: 02:00:00 25 mg via INTRAVENOUS

## 2021-07-12 MED ORDER — ACETAMINOPHEN 325 MG PO TAB
650 mg | ORAL | 0 refills | Status: AC | PRN
Start: 2021-07-12 — End: ?
  Administered 2021-07-13 – 2021-07-15 (×2): 650 mg via ORAL

## 2021-07-12 NOTE — Progress Notes
No surgeon on today at sending    [facility]    Presented to clinic with bilat. Perirectal abscesses, one on right has opened and is draining purulent drainage, have been there     Surgery (midlevel provider, can't take pt. 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 1.0    Pt. Is alert and oriented   8 day sago, mini stroke, minimal right sided weakness, on   baby ASA     PMH:  HPL,     1.5 Vancomycin

## 2021-07-13 ENCOUNTER — Encounter: Admit: 2021-07-13 | Discharge: 2021-07-13 | Payer: MEDICARE

## 2021-07-13 ENCOUNTER — Inpatient Hospital Stay: Admit: 2021-07-13

## 2021-07-13 MED ADMIN — FENTANYL CITRATE (PF) 50 MCG/ML IJ SOLN [3037]: 50 ug | INTRAVENOUS | @ 05:00:00 | Stop: 2021-07-13 | NDC 00409909425

## 2021-07-13 MED ADMIN — DEXAMETHASONE SODIUM PHOSPHATE 4 MG/ML IJ SOLN [2332]: 2 mg | INTRAVENOUS | @ 05:00:00 | Stop: 2021-07-13 | NDC 63323016501

## 2021-07-13 MED ADMIN — FENTANYL CITRATE (PF) 50 MCG/ML IJ SOLN [3037]: 50 ug | INTRAVENOUS | @ 06:00:00 | Stop: 2021-07-13 | NDC 00409909425

## 2021-07-13 MED ADMIN — ALBUTEROL SULFATE 90 MCG/ACTUATION IN HFAA [40196]: 6 | RESPIRATORY_TRACT | @ 05:00:00 | Stop: 2021-07-13 | NDC 00173068220

## 2021-07-13 MED ADMIN — ONDANSETRON HCL (PF) 4 MG/2 ML IJ SOLN [136012]: 4 mg | INTRAVENOUS | @ 06:00:00 | Stop: 2021-07-13 | NDC 25021077702

## 2021-07-13 MED ADMIN — PHENYLEPHRINE HCL IN 0.9% NACL 1 MG/10 ML (100 MCG/ML) IV SYRG [303060]: 100 ug | INTRAVENOUS | @ 05:00:00 | Stop: 2021-07-13 | NDC 69374095710

## 2021-07-13 MED ADMIN — SUGAMMADEX 100 MG/ML IV SOLN [328079]: 100 mg | INTRAVENOUS | @ 06:00:00 | Stop: 2021-07-13 | NDC 00006542312

## 2021-07-13 MED ADMIN — ASPIRIN 81 MG PO CHEW [680]: 324 mg | ORAL | @ 21:00:00 | NDC 16103036611

## 2021-07-13 MED ADMIN — PROPOFOL INJ 10 MG/ML IV VIAL [210331]: 20 mg | INTRAVENOUS | @ 05:00:00 | Stop: 2021-07-13 | NDC 25021060820

## 2021-07-13 MED ADMIN — SUGAMMADEX 100 MG/ML IV SOLN [328079]: 360 mg | INTRAVENOUS | @ 06:00:00 | Stop: 2021-07-13 | NDC 00006542312

## 2021-07-13 MED ADMIN — SODIUM CHLORIDE 0.9 % IV PGBK (MB+) [95161]: 100 mL | INTRAVENOUS | @ 13:00:00 | Stop: 2021-07-13 | NDC 00338915930

## 2021-07-13 MED ADMIN — SODIUM HYPOCHLORITE 0.5 % MISC SOLN [83486]: 473 mL | @ 06:00:00 | Stop: 2021-07-13 | NDC 39328006250

## 2021-07-13 MED ADMIN — ROSUVASTATIN 20 MG PO TAB [88504]: 10 mg | ORAL | @ 21:00:00 | NDC 68462026390

## 2021-07-13 NOTE — Anesthesia Pre-Procedure Evaluation
Anesthesia Pre-Procedure Evaluation    Name: Larry Hoffman      MRN: [id number]     DOB: 04-25-1951     Age: 70 y.o.     Sex: male   _________________________________________________________________________     Procedure Info:   Procedure Information     Date/Time: 07/14/21 0800    Procedure: INCISION AND DRAINAGE ABSCESS COMPLICATED/ MULTIPLE - GENITAL/ PELVIC (N/A ) - 30 min    Location: MAIN OR 06 / Main OR/Periop    Surgeons: Guinevere Scarlet, MD          Physical Assessment  Vital Signs (last filed in past 24 hours):  BP: 131/87 (10/26 1100)  Temp: 36.4 ?C (97.6 ?F) (10/26 1600)  Pulse: 50 (10/26 1100)  Respirations: 27 PER MINUTE (10/26 1100)  SpO2: 94 % (10/26 1100)  O2 Device: None (Room air) (10/26 1100)  O2 Liter Flow: 2 Lpm (10/25 2100)  Height: 172.7 cm (5' 8) (10/25 1924)  Weight: 89.1 kg (196 lb 6.9 oz) (10/25 1924)      Patient History   Allergies   Allergen Reactions   ? Zosyn [Piperacillin-Tazobactam] ANAPHYLAXIS        Current Medications    Not on File         Review of Systems/Medical History        PONV Screening: Non-smoker              Neuro/Psych         CVA      Musculoskeletal         Soft tissue infection     PHYSICAL EXAM     Diagnostic Tests  Hematology:   Lab Results   Component Value Date    HGB 13.9 07/13/2021    HCT 41.1 07/13/2021    PLTCT 491 07/13/2021    WBC 10.4 07/13/2021    MCV 90.9 07/13/2021    MCH 30.7 07/13/2021    MCHC 33.8 07/13/2021    MPV 7.8 07/13/2021    RDW 13.9 07/13/2021         General Chemistry:   Lab Results   Component Value Date    NA 139 07/13/2021    K 5.1 07/13/2021    CL 107 07/13/2021    CO2 23 07/13/2021    GAP 9 07/13/2021    BUN 13 07/13/2021    CR 1.12 07/13/2021    GLU 123 07/13/2021    CA 8.8 07/13/2021    ALBUMIN 3.3 07/12/2021    LACTIC 0.9 07/12/2021    MG 2.0 07/13/2021    TOTBILI 1.0 07/12/2021    PO4 3.3 07/13/2021      Coagulation: No results found for: PT, PTT, INR      Anesthesia Plan    ASA score: 3   Plan: general  Induction method: intravenous  NPO status: acceptable    Comments: (70yM w/ HTN, recent CVA (approx 10/19) w/ residual L-sided weakness on asa 325/statin and L gluteal soft tissue infxn (cefepime/metro/linezolid) s/f debridement. Plan GETA, prone positioning.)

## 2021-07-13 NOTE — Progress Notes
2025  Pt complaining of having trouble breathing, pt noted to have flushed skin, very red all over face, chest and torso, increased cough.  Pt also complaining of chills, antibiotic stopped, oxygen placed via NC, Dr Lendell Caprice notified and at bedside, order received for Benadryl and given per order    2100 Pt states breathing better, still noted with reddened areas and cough, NC titrated down to 2Lpm    2200  Pt took NC off, SpO2 95%

## 2021-07-14 ENCOUNTER — Encounter: Admit: 2021-07-14 | Discharge: 2021-07-14 | Payer: MEDICARE

## 2021-07-14 MED ADMIN — ASPIRIN 81 MG PO CHEW [680]: 81 mg | ORAL | @ 18:00:00 | NDC 16103036611

## 2021-07-14 MED ADMIN — ROSUVASTATIN 20 MG PO TAB [88504]: 10 mg | ORAL | @ 14:00:00 | NDC 68462026390

## 2021-07-14 MED ADMIN — CALCIUM CARBONATE 200 MG CALCIUM (500 MG) PO CHEW [9385]: 500 mg | ORAL | @ 21:00:00 | NDC 66553000401

## 2021-07-14 MED ADMIN — LINEZOLID 600 MG PO TAB [80084]: 600 mg | ORAL | @ 14:00:00 | Stop: 2021-07-14 | NDC 00904655304

## 2021-07-14 MED ADMIN — METRONIDAZOLE IN NACL (ISO-OS) 500 MG/100 ML IV PGBK [5018]: 500 mg | INTRAVENOUS | @ 01:00:00 | NDC 47335099301

## 2021-07-14 MED ADMIN — METRONIDAZOLE IN NACL (ISO-OS) 500 MG/100 ML IV PGBK [5018]: 500 mg | INTRAVENOUS | @ 14:00:00 | Stop: 2021-07-14 | NDC 47335099301

## 2021-07-15 MED ADMIN — ASPIRIN 81 MG PO CHEW [680]: 81 mg | ORAL | @ 13:00:00 | Stop: 2021-07-15 | NDC 16103036611

## 2021-07-15 MED ADMIN — ROSUVASTATIN 20 MG PO TAB [88504]: 10 mg | ORAL | @ 13:00:00 | Stop: 2021-07-15 | NDC 68462026390

## 2021-07-15 MED ADMIN — SODIUM HYPOCHLORITE 0.25 % MISC SOLN [83485]: 473.000 mL | @ 15:00:00 | Stop: 2021-07-15 | NDC 39328006325

## 2021-07-15 MED ADMIN — CALCIUM CARBONATE 200 MG CALCIUM (500 MG) PO CHEW [9385]: 500 mg | ORAL | @ 02:00:00 | NDC 66553000401

## 2021-07-26 ENCOUNTER — Encounter: Admit: 2021-07-26 | Discharge: 2021-07-26 | Payer: MEDICARE

## 2021-07-26 ENCOUNTER — Observation Stay: Admit: 2021-07-26 | Discharge: 2021-07-26 | Payer: MEDICARE

## 2021-07-26 ENCOUNTER — Ambulatory Visit: Admit: 2021-07-26 | Discharge: 2021-07-26 | Payer: MEDICARE

## 2021-07-26 DIAGNOSIS — K610 Anal abscess: Secondary | ICD-10-CM

## 2021-07-26 DIAGNOSIS — K611 Rectal abscess: Secondary | ICD-10-CM

## 2021-07-26 MED ORDER — ARTIFICIAL TEARS (PF) SINGLE DOSE DROPS GROUP
OPHTHALMIC | 0 refills | Status: DC
Start: 2021-07-26 — End: 2021-07-26
  Administered 2021-07-26: 20:00:00 2 [drp] via OPHTHALMIC

## 2021-07-26 MED ORDER — MEPERIDINE (PF) 25 MG/ML IJ SYRG
12.5 mg | INTRAVENOUS | 0 refills | Status: DC | PRN
Start: 2021-07-26 — End: 2021-07-26

## 2021-07-26 MED ORDER — ONDANSETRON HCL (PF) 4 MG/2 ML IJ SOLN
INTRAVENOUS | 0 refills | Status: DC
Start: 2021-07-26 — End: 2021-07-26
  Administered 2021-07-26: 20:00:00 4 mg via INTRAVENOUS

## 2021-07-26 MED ORDER — DEXAMETHASONE SODIUM PHOSPHATE 4 MG/ML IJ SOLN
4 mg | Freq: Once | INTRAVENOUS | 0 refills | Status: DC | PRN
Start: 2021-07-26 — End: 2021-07-26

## 2021-07-26 MED ORDER — HYDROMORPHONE (PF) 2 MG/ML IJ SYRG
1 mg | INTRAVENOUS | 0 refills | Status: DC | PRN
Start: 2021-07-26 — End: 2021-07-26

## 2021-07-26 MED ORDER — FENTANYL CITRATE (PF) 50 MCG/ML IJ SOLN
12.5 ug | INTRAVENOUS | 0 refills | Status: DC | PRN
Start: 2021-07-26 — End: 2021-07-26

## 2021-07-26 MED ORDER — LACTATED RINGERS IV SOLP
1000 mL | INTRAVENOUS | 0 refills | Status: DC
Start: 2021-07-26 — End: 2021-07-27
  Administered 2021-07-26: 19:00:00 1000 mL via INTRAVENOUS

## 2021-07-26 MED ORDER — HALOPERIDOL LACTATE 5 MG/ML IJ SOLN
1 mg | Freq: Once | INTRAVENOUS | 0 refills | Status: DC | PRN
Start: 2021-07-26 — End: 2021-07-26

## 2021-07-26 MED ORDER — PROPOFOL INJ 10 MG/ML IV VIAL
INTRAVENOUS | 0 refills | Status: DC
Start: 2021-07-26 — End: 2021-07-26
  Administered 2021-07-26: 21:00:00 50 mg via INTRAVENOUS
  Administered 2021-07-26: 20:00:00 100 mg via INTRAVENOUS

## 2021-07-26 MED ORDER — DEXAMETHASONE SODIUM PHOSPHATE 4 MG/ML IJ SOLN
INTRAVENOUS | 0 refills | Status: DC
Start: 2021-07-26 — End: 2021-07-26
  Administered 2021-07-26: 20:00:00 4 mg via INTRAVENOUS

## 2021-07-26 MED ORDER — OXYCODONE 5 MG PO TAB
5 mg | Freq: Once | ORAL | 0 refills | Status: DC | PRN
Start: 2021-07-26 — End: 2021-07-26

## 2021-07-26 MED ORDER — ONDANSETRON HCL (PF) 4 MG/2 ML IJ SOLN
4 mg | Freq: Once | INTRAVENOUS | 0 refills | Status: DC | PRN
Start: 2021-07-26 — End: 2021-07-26

## 2021-07-26 MED ORDER — LIDOCAINE (PF) 200 MG/10 ML (2 %) IJ SYRG
INTRAVENOUS | 0 refills | Status: DC
Start: 2021-07-26 — End: 2021-07-26
  Administered 2021-07-26: 20:00:00 80 mg via INTRAVENOUS

## 2021-07-26 MED ORDER — CEFOXITIN 2 GRAM IV SOLR
INTRAVENOUS | 0 refills | Status: DC
Start: 2021-07-26 — End: 2021-07-26
  Administered 2021-07-26: 20:00:00 2 g via INTRAVENOUS

## 2021-07-26 MED ORDER — LIDOCAINE-EPINEPHRINE 1 %-1:100,000 IJ SOLN
0 refills | Status: DC
Start: 2021-07-26 — End: 2021-07-26
  Administered 2021-07-26: 21:00:00 20 mL via INTRAMUSCULAR

## 2021-07-26 MED ORDER — ACETAMINOPHEN 325 MG PO TAB
650 mg | ORAL | 0 refills | Status: DC | PRN
Start: 2021-07-26 — End: 2021-07-27

## 2021-07-26 MED ORDER — FENTANYL CITRATE (PF) 50 MCG/ML IJ SOLN
INTRAVENOUS | 0 refills | Status: DC
Start: 2021-07-26 — End: 2021-07-26
  Administered 2021-07-26 (×2): 50 ug via INTRAVENOUS

## 2021-07-26 MED ORDER — ONDANSETRON HCL (PF) 4 MG/2 ML IJ SOLN
4 mg | INTRAVENOUS | 0 refills | Status: DC | PRN
Start: 2021-07-26 — End: 2021-07-27

## 2021-07-26 MED ORDER — ONDANSETRON 4 MG PO TBDI
4 mg | ORAL | 0 refills | Status: DC | PRN
Start: 2021-07-26 — End: 2021-07-27

## 2021-07-26 MED ORDER — SUCCINYLCHOLINE CHLORIDE 20 MG/ML IJ SOLN
INTRAVENOUS | 0 refills | Status: DC
Start: 2021-07-26 — End: 2021-07-26
  Administered 2021-07-26: 20:00:00 120 mg via INTRAVENOUS

## 2021-07-26 NOTE — Progress Notes
ACUTE CARE SURGERY CLINIC NOTE    Date of Service: 07/26/2021    CC:  Ongoing perineal drainage    HPI:  70 year old male presents for follow up after I&D of perirectal abscesses on 10/25.  He reports new onset 24-48 hours more acute posterior midline perianal pain and drainage.  Otherwise doing well and performing wound care as instructed.  Last water intake 3 hours ago, last food intake 11/7 lunch.    HISTORY:  History reviewed. No pertinent past medical history.  Surgical History:   Procedure Laterality Date   ? INCISION AND DRAINAGE ABSCESS COMPLICATED/ MULTIPLE - GENITAL/ PELVIC N/A 07/12/2021    Performed by Ronn Melena, MD at [facility] OR     History reviewed. No pertinent family history.  Social History     Socioeconomic History   ? Marital status: Single   Tobacco Use   ? Smoking status: Never Smoker   ? Smokeless tobacco: Never Used                 Current Outpatient Medications on File Prior to Visit   Medication Sig Dispense Refill   ? acetaminophen (TYLENOL) 325 mg tablet Take two tablets by mouth every 4 hours as needed for up to 30 days. 30 tablet 0   ? aspirin 81 mg chewable tablet Chew one tablet by mouth daily. Take with food. 90 tablet 0   ? oxyCODONE-acetaminophen (PERCOCET) 7.5-325 mg tablet Take 1 tablet by mouth every 6 hours as needed for Pain.     ? rosuvastatin (CRESTOR) 10 mg tablet Take one tablet by mouth daily. 90 tablet 0   ? sodium hypochlorite (DAKIN'S 1/2 STRENGTH) 0.25 % topical solution Apply  topically to affected area as Needed. Soak packing material and then pack into wound. 473 mL 0     No current facility-administered medications on file prior to visit.       REVIEW OF SYSTEMS:  Review of Systems   Constitutional: Negative for chills and fever.   HENT: Negative.    Eyes: Negative.    Respiratory: Negative.    Cardiovascular: Negative.    Gastrointestinal: Negative.    Genitourinary: Negative.    Musculoskeletal: Negative.    Skin: Negative.    Neurological: Negative. Endo/Heme/Allergies: Negative.    Psychiatric/Behavioral: Negative.        EXAM:  Vitals:    07/26/21 0809   BP: 115/68   Pulse: 83   Temp: 36.7 ?C (98 ?F)   Resp: 16     Physical Exam  Exam conducted with a chaperone present.   Constitutional:       Appearance: Normal appearance.   HENT:      Head: Normocephalic and atraumatic.      Nose: Nose normal.      Mouth/Throat:      Mouth: Mucous membranes are moist.   Eyes:      Conjunctiva/sclera: Conjunctivae normal.   Cardiovascular:      Rate and Rhythm: Normal rate.   Pulmonary:      Effort: Pulmonary effort is normal.   Abdominal:      General: Abdomen is flat.      Palpations: Abdomen is soft.   Genitourinary:     Comments: Upon exam of the perineum we note well healing bilateral previous I&D incisions with granulation tissue.  In the posterior midline adjacent to the anus we note an area of erythema, fluctuance and tenderness concerning for perianal abscess vs fistula.  Musculoskeletal:  General: Normal range of motion.      Cervical back: Normal range of motion.   Skin:     General: Skin is warm.   Neurological:      General: No focal deficit present.      Mental Status: He is alert.   Psychiatric:         Mood and Affect: Mood normal.         IMAGING/LABS:  Complete Blood Count    Lab Results   Component Value Date    HGB 14.4 07/15/2021    HGB 13.5 07/14/2021    HGB 13.9 07/13/2021    HGB 15.3 07/12/2021     Lab Results   Component Value Date    PLTCT 473 (H) 07/15/2021    PLTCT 457 (H) 07/14/2021    PLTCT 491 (H) 07/13/2021    PLTCT 517 (H) 07/12/2021     Lab Results   Component Value Date    WBC 9.2 07/15/2021    WBC 10.4 07/14/2021    WBC 10.4 07/13/2021    WBC 11.4 (H) 07/12/2021       Basic Chemistry    Lab Results   Component Value Date    NA 136 (L) 07/15/2021    NA 136 (L) 07/15/2021    NA 137 07/14/2021    NA 139 07/13/2021    NA 138 07/12/2021     Lab Results   Component Value Date    K 4.7 07/15/2021    K 4.9 07/15/2021    K 5.2 (H) 07/14/2021    K 5.1 07/13/2021    K 5.5 (H) 07/12/2021     Lab Results   Component Value Date    CO2 25 07/15/2021    CO2 23 07/15/2021    CO2 23 07/14/2021    CO2 23 07/13/2021    CO2 21 07/12/2021     Lab Results   Component Value Date    BUN 14 07/15/2021    BUN 13 07/15/2021    BUN 13 07/14/2021    BUN 13 07/13/2021    BUN 14 07/12/2021     Lab Results   Component Value Date    CR 0.97 07/15/2021    CR 0.97 07/15/2021    CR 0.90 07/14/2021    CR 1.12 07/13/2021    CR 0.94 07/12/2021     Lab Results   Component Value Date    CL 102 07/15/2021    CL 102 07/15/2021    CL 105 07/14/2021    CL 107 07/13/2021    CL 106 07/12/2021       ASSESSMENT/PLAN:  Larry Hoffman is a 70 y.o. who presents for evaluation following I&D of perineal abscesses, now with new or recurrent perianal abscess.  His previous wounds are healing well, but he would benefit from undergoing repeat EUA with I&D of new posterior midline abscess with possible seton placement if fistula identified.  He is amenable to undergoing this procedure via admission from clinic and add-on OR.  His NPO status is acceptable for OR today.  We discussed the risks and benefits of surgery and obtained consent.    ATTESTATION:  I personally performed the above service including evaluation, decision making, patient counseling, documentation and review of imaging/labs.  A total time of 30 minutes was spent caring for the patient.    Lamont Snowball, MD

## 2021-07-26 NOTE — Case Management (ED)
Case Management 30 Day Readmission Assessment      NAME: Larry Hoffman                    MRN: [id number]              DOB: 1951-02-16          AGE: 70 y.o.  ADMISSION DATE: 07/26/2021             DAYS ADMITTED: LOS: 0 days    Today's Date: 07/26/2021    Source of Information: Patient and EMR    Expected Discharge Date:  07/27/2021     Plan:    OR: 07/26/21  EXAM ANORECTAL UNDER ANESTHESIA [45409 (CPT?)]    Anticipated to discharge home with support from significant other and resume home health with [facility].     Previous Inpatient Discharge Date:    07/15/21    Previous Case Management admission assessment dated 07/12/21 is copied at the end of this note for reference and was reviewed with patient/family.  Changes to information from previous admission assessment are: none.     Acute Hospital Stay:  Acute Hospital Stay: In the past  Was patient's stay within the last 30 days?: Yes  Name of Hospital: [facility]  Readmission Code Group: 5. Medical Plan of Care - Treatment or Possible Complication  5. Medical Plan of Care - Treatment or Possible Complication: 5a. Possible infection    Prior Living Situation/Admitted from?   Type of Residence: Home, independent    Post-Acute Services/DME Last Discharge:  Were post-acute services/DME arranged at previous disharge?: Yes  Does patient/family feel services/DME were adequate?: Yes    Lack of services:  At or after previous discharge, what did the patient/family feel they lacked?: Adequate services arranged    Previous Discharge Resources Provided:  Were resources provided to patient at previous discharge in person or within AVS?: Yes  Which resources were tried?:  (home health and dressing supplies were arranged)    Social Determinants of Health (SDOH):  Within the past 12 months we worried whether our food would run out before we got money to buy more.: Never True  Within the past 12 months the food we bought just didn't last and we didn't have money to get more.: Never True  In the last 12 months, have you needed to see a doctor, but could not because of cost?: No  In the past 12 months, have you ever gone without  health care because you didn't have a way to get there?: No  In the last 12 months, did you skip medications to save money?: No  In the last 12 months, has your utility company shut off your service for not paying your bills?: No  Are you worried that in the next 2 months, you may not have stable housing?: No    Transportation:  Does the Patient Need Case Management to Arrange Discharge Transport? (ex: facility, ambulance, wheelchair/stretcher, Medicaid, cab, other): No  Will the Patient Use Family Transport?: Yes  Transportation Name, Phone and Availability #1:  (Sister Larry)    Source of Information: patient at bedside  ?  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   ?  Plan  Plan: Case Management Assessment, Assist PRN with SW/NCM Services, Discharge Planning for Home with Post-Acute Care Needs, Discharge  Planning for Post-Acute Facility   ?  This CM met with pt for assessment on this date.  Provided contact information and explanation of SW/NCM roles.  Reviewed [facility], Preparing for Discharge, and Preferred Provider Network hand-outs.  Provided opportunity for questions and discussion. Pt/family encouraged to contact Case Management team with questions and concerns during hospitalization and until patient is able to transition back to the patient's primary care physician.  ?  *NCM visited with patient at bedside.  *Role of NCM explained and business card provided.  *Pt states he's independent with self-care & ADL's PTA & has no concerns for discharge.  *DC Transport:  Pts sister, Larry   *DME:  None  *HH / HI:  None; patient states his GF can handle the dressing care upon d/c. Patient further states that he will think about home health services for dressing follow up and assistance.   *SNF / IPR / LTACH:  None  *No NCM needs identified at this time. NCM encouraged pt to reach out if any needs should change. Pt verbalized understanding.  *NCM will continue to follow and assess for additional needs through discharge.  ?  ?  Patient Address/Phone  [address]  [place]  [place] [address]  There are no phone numbers on file.  ?  Emergency Contact  Extended Emergency Contact Information  Primary Emergency Contact: Hoffman,Larry  Mobile Phone: [phone]  Relation: Son  Secondary Emergency Contact: Hoffman,Larry  Mobile Phone: [phone]  Relation: Sister  ?  Healthcare Directive  Healthcare Directive: No, patient does not have a healthcare directive  Would patient like to fill out a (a new) Healthcare Directive?: No, patient declined  Psych Advance Directive (Psych unit only): No, patient does not have a Psych Advance Directive  ?  ?  Transportation  Does the Patient Need Case Management to Arrange Discharge Transport? (ex: facility, ambulance, wheelchair/stretcher, Medicaid, cab, other): No  Will the Patient Use Family Transport?: Yes  Transportation Name, Phone and Availability #1:  (Sister Larry Hoffman)  ?  Expected Discharge Date  07/16/2021   ?  Living Situation Prior to Admission  ? Living Arrangements  Type of Residence: Home, independent  Living Arrangements: Spouse/significant other, Children (GF and their 84 year old son)  Financial risk analyst / Tub: Tub/Shower Unit  How many levels in the residence?: 1  Can patient live on one level if needed?: Yes  Does residence have entry and/or side stairs?: No (2STE)  Assistance needed prior to admit or anticipated on discharge: No  Who provides assistance or could if needed?:  (girlfriend - Larry Hoffman)  Are they in good health?: Yes  Can support system provide 24/7 care if needed?: Maybe  ? Level of Function   Prior level of function: Independent  ? Cognitive Abilities   Cognitive Abilities: Alert and Oriented, Engages in problem solving and planning, Participates in decision making, Recognizes impact of health condition on lifestyle  ?  Financial Resources  ? Coverage  Primary Insurance: Medicare  Secondary Insurance: No insurance  Additional Coverage: RX    ? Source of Income   Source Of Income: SSI  ? Financial Assistance Needed?  None identified at this time   ?  Psychosocial Needs  ? Mental Health  Mental Health History: No  ? 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 Other  None identified at this time   ?  Current/Previous Services  ? PCP  No primary care provider on file., None, None  ? Pharmacy  No Pharmacies Listed  ? Durable Medical Equipment   Durable Medical Equipment at home: None  ? Home Health  Receiving home health: No  ? Hemodialysis or Peritoneal Dialysis  Undergoing hemodialysis or peritoneal dialysis: No  ? Tube/Enteral Feeds  Receive tube/enteral feeds: No  ? Infusion  Receive infusions: No  ? Private Duty  Private duty help used: No  ? Home and Community Based Services  Home and community based services: No  ? [employer] Supply: N/A  ? Hospice  Hospice: No  ? Outpatient Therapy  PT: No  OT: No  SLP: No  ? Skilled Nursing Facility/Nursing Home  SNF: No  NH: No  ? Inpatient Rehab  IPR: No  ? Long-Term Acute Care Hospital  LTACH: No  ? Acute Hospital Stay  Acute Hospital Stay: No  ?    Teodoro Spray RN, BSN, NCM  Integrated Nurse Case Manager  Phone: [phone]  Pager: [phone]

## 2021-07-26 NOTE — H&P (View-Only)
[facility] H&P  07/26/2021     Patient: Larry Hoffman  MRN: [id number]    Admission Date:  07/26/21, LOS: 0 days  Admission Diagnosis: Perianal abscess [K61.0]    Attending Surgeon: Dr. Massie Maroon  Consult Performed by: Cristobal Goldmann, MD    ASSESSMENT: 70 y.o. male with a recent CVA who underwent incision and drainage of bilateral gluteal abscesses on 10/25. He presents with a new draining sinus superior to his rectum    PLAN:  - Admit from clinic for planned EUA today  - NPO for procedure  - Risks, benefits, alternatives, and potential complications including bleeding, infection, damage to surrounding structures, and possible need for additional procedures discussed at length with patient. All questions answered in detail to patient's satisfaction. Patient wishes to proceed with surgery at this time.  - Anticipate likely discharge to home later today following EUA    Discussed plan of care with staff surgeon, Dr. Massie Maroon, who directed plan of care  _____________________________________________________________________________    HPI: Larry Hoffman is a 70 y.o. male with a recent CVA who underwent incision and drainage of bilateral gluteal abscesses on 10/25. He presents with a new draining sinus superior to his rectum. Patient was seen earlier in clinic this AM. Please see separate clinic dictation.    No past medical history on file.  Surgical History:   Procedure Laterality Date   ? INCISION AND DRAINAGE ABSCESS COMPLICATED/ MULTIPLE - GENITAL/ PELVIC N/A 07/12/2021    Performed by Ronn Melena, MD at [facility] OR     No family history on file.  Social History     Tobacco Use   ? Smoking status: Never Smoker   ? Smokeless tobacco: Never Used   Substance Use Topics   ? Alcohol use: Not on file     Your Current Medications:       Instructions    acetaminophen (TYLENOL) 325 mg tablet Take two tablets by mouth every 4 hours as needed for up to 30 days.    aspirin 81 mg chewable tablet Chew one tablet by mouth daily. Take with food.    oxyCODONE-acetaminophen (PERCOCET) 7.5-325 mg tablet Take 1 tablet by mouth every 6 hours as needed for Pain.    rosuvastatin (CRESTOR) 10 mg tablet Take one tablet by mouth daily.    sodium hypochlorite (DAKIN'S 1/2 STRENGTH) 0.25 % topical solution Apply  topically to affected area as Needed. Soak packing material and then pack into wound.        ROS:  Review of Systems   Constitutional: Negative.    HENT: Negative.    Eyes: Negative.    Respiratory: Negative.    Cardiovascular: Negative.    Gastrointestinal: Negative.    Genitourinary: Negative.    Musculoskeletal: Negative.    Skin: Negative.    Neurological: Negative.    Endo/Heme/Allergies: Negative.    Psychiatric/Behavioral: Negative.    All other systems reviewed and are negative.    Vitals:  BP: (115)/(68)   Temp:  [36.7 ?C (98 ?F)]   Pulse:  [83]   Physical Exam  Vitals reviewed.   Constitutional:       Appearance: He is well-developed.   HENT:      Head: Normocephalic and atraumatic.   Pulmonary:      Effort: Pulmonary effort is normal.   Abdominal:      Palpations: Abdomen is soft.   Genitourinary:         Comments: Healing bilateral perineal incisions  with beefy red granulation tissue. No drainage. Wounds repacked.    Superior to rectum in gluteal cleft there is a punctate area of drainage indicative of a small abscess with possible fistulous connection.  Musculoskeletal:         General: Normal range of motion.   Skin:     General: Skin is warm and dry.      Capillary Refill: Capillary refill takes less than 2 seconds.   Neurological:      Mental Status: He is alert and oriented to person, place, and time.   Psychiatric:         Behavior: Behavior normal.       Lab/Radiology/Other Diagnostic Tests:  Lab Results   Component Value Date/Time    HGB 14.4 07/15/2021 0430    HCT 41.2 07/15/2021 0430    WBC 9.2 07/15/2021 0430    PLTCT 473 (H) 07/15/2021 0430     Lab Results   Component Value Date/Time    NA 136 (L) 07/15/2021 0430    K 4.7 07/15/2021 0430    CL 102 07/15/2021 0430    CO2 25 07/15/2021 0430    BUN 14 07/15/2021 0430    CR 0.97 07/15/2021 0430     No orders to display       Cristobal Goldmann, MD  Team Pager: [phone]

## 2021-07-26 NOTE — Anesthesia Post-Procedure Evaluation
Post-Anesthesia Evaluation    Name: Larry Hoffman      MRN: [id number]     DOB: 1951-01-04     Age: 70 y.o.     Sex: male   __________________________________________________________________________     Procedure Information     Anesthesia Start Date/Time: 07/26/21 1408    Procedures:       INCISION AND DRAINAGE ISCHIORECTAL/ PERIRECTAL ABSCESS (N/A Buttocks) - acs bill area      PLACEMENT SETON (N/A Buttocks) - acs bill area    Location: MAIN OR 03 / Main OR/Periop    Surgeons: Trenda Moots, MD          Post-Anesthesia Vitals  BP: 120/89 (11/08 1530)  Temp: 37.1 C (98.7 F) (11/08 1500)  Pulse: 79 (11/08 1545)  Respirations: 15 PER MINUTE (11/08 1545)  SpO2: 94 % (11/08 1545)  SpO2 Pulse: 79 (11/08 1545)  O2 Device: None (Room air) (11/08 1545)   Vitals Value Taken Time   BP 120/89 07/26/21 1530   Temp 37.1 C (98.7 F) 07/26/21 1500   Pulse 79 07/26/21 1545   Respirations 15 PER MINUTE 07/26/21 1545   SpO2 94 % 07/26/21 1545   O2 Device None (Room air) 07/26/21 1545   ABP     ART BP           Post Anesthesia Evaluation Note    Evaluation location: Pre/Post  Patient participation: recovered; patient participated in evaluation  Level of consciousness: alert    Pain score: 0  Pain management: adequate    Hydration: normovolemia  Temperature: 36.0C - 38.4C  Airway patency: adequate    Perioperative Events       Post-op nausea and vomiting: no PONV    Postoperative Status  Cardiovascular status: hemodynamically stable  Respiratory status: spontaneous ventilation  Follow-up needed: none        Perioperative Events  There were no known complications for this encounter.

## 2021-07-26 NOTE — Other
Brief Operative Note    Name: Larry Hoffman is a 70 y.o. male     DOB: 11/11/1950             MRN#: [id number]  DATE OF OPERATION: 07/26/2021    Date:  07/26/2021        Preoperative Dx:   Perianal abscess [K61.0]    Post-op Diagnosis      * Perianal abscess [K61.0]    Procedure(s):  INCISION AND DRAINAGE ISCHIORECTAL/ PERIRECTAL ABSCESS  PLACEMENT SETON    Surgeon(s) and Role:     * McCoy, Sol Passer, MD - Primary     * Cristobal Goldmann., MD - Resident - Assisting    Findings:  Fistulous tract between rectum and draining sinus posterior/superior to rectum that involved external sphincter. Seton drain placed using large blue vessel loop.    Estimated Blood Loss: 10cc    Specimen(s) Removed/Disposition: None    Complications:  None    Drains: Seton drain    Disposition:  PACU - stable    Cristobal Goldmann, MD  Pager [phone]

## 2021-07-26 NOTE — Patient Instructions
Please contact our clinic if you have any questions or concerns. Call us at [phone] or send an email through the MyChart system if you have non-urgent concerns. We will get back to you as soon as possible. For urgent or after hours needs, please call [phone] and ask for the [facility] on call provider.     NOTE: MyChart messages and phone calls received on weekends, on holidays, and after 4 pm on weekdays will NOT be seen until the following business day. If you have an urgent matter during these times, please call [phone] to reach the on-call team.     If you need prescription refills, please contact your pharmacy.     Our fax number is [phone]. Please be sure your name and date of birth are on any forms that are sent to us for completion. We will have them filled out and signed as soon as possible, but our providers are not in clinic every day, so it can take up to a week.     Lab and test results:  As a part of the CARES act, starting 12/18/2019, some results will be released to you via MyChart immediately and automatically.  You may see results before your provider sees them; however, your provider will review all these results and then they, or one of their team, will notify you of result information and recommendations.   Critical results will be addressed immediately, but otherwise, please allow us time to get back with you prior to you reaching out to us for questions.  This will usually take about 72 hours for labs and 5-7 days for procedure test results.

## 2021-07-28 ENCOUNTER — Encounter: Admit: 2021-07-28 | Discharge: 2021-07-28 | Payer: MEDICARE

## 2021-08-09 ENCOUNTER — Encounter: Admit: 2021-08-09 | Discharge: 2021-08-09 | Payer: MEDICARE

## 2021-08-09 ENCOUNTER — Ambulatory Visit: Admit: 2021-08-09 | Discharge: 2021-08-09 | Payer: MEDICARE

## 2021-08-09 DIAGNOSIS — K610 Anal abscess: Secondary | ICD-10-CM

## 2021-08-09 NOTE — Patient Instructions
Please contact our clinic if you have any questions or concerns. Call us at [phone] or send an email through the MyChart system if you have non-urgent concerns. We will get back to you as soon as possible. For urgent or after hours needs, please call [phone] and ask for the [facility] on call provider.     NOTE: MyChart messages and phone calls received on weekends, on holidays, and after 4 pm on weekdays will NOT be seen until the following business day. If you have an urgent matter during these times, please call [phone] to reach the on-call team.     If you need prescription refills, please contact your pharmacy.     Our fax number is [phone]. Please be sure your name and date of birth are on any forms that are sent to us for completion. We will have them filled out and signed as soon as possible, but our providers are not in clinic every day, so it can take up to a week.     Lab and test results:  As a part of the CARES act, starting 12/18/2019, some results will be released to you via MyChart immediately and automatically.  You may see results before your provider sees them; however, your provider will review all these results and then they, or one of their team, will notify you of result information and recommendations.   Critical results will be addressed immediately, but otherwise, please allow us time to get back with you prior to you reaching out to us for questions.  This will usually take about 72 hours for labs and 5-7 days for procedure test results.

## 2021-08-16 ENCOUNTER — Encounter: Admit: 2021-08-16 | Discharge: 2021-08-16 | Payer: MEDICARE

## 2021-08-16 NOTE — Telephone Encounter
New Patient Appointment with Colorectal Surgery (CRS)  Date: Tues (09/13/21)  Time: 10AM  CRS Provider Name (full name): Dr. Eulah Pont    Scheduled appointment with patient's sister, Samantha Crimes, who will transport patient.     Clinic Location [facility] or [facility] w/address): [facility], [address] [place], [address], [place], [place]  [address]    Verbal directions to appointment given on (date): 11.29.22     Patient has MyChart Access (yes, no w/reason or date instructions sent to pt):  Emailed MyChart Access instructions on 11.29.22     Other Appt/Tests Coordinated with this Appt (n/a or test name w/date & time): n/a    Need Hoyer Lift (yes or n/a. For disabled patients.): n/a    Engineer, technical sales (n/a, Language or Language & ID # if used for this appt): n/a    Patient expressed understanding of below:   -Check-in 30 min early (yes/no):  yes  -Wait List (yes, n/a or pt declined): yes  -No-Show/Late Cancellation Policy (yes, or no w/reason): yes  The surgeons ask that we let all patients know of the NS/LC Policy here at [facility] for all appointments: If you need to cancel/reschedule, please try and let us know as far in advance as possible. If you cancel after 12PM the day before the appointment, it is considered a NS/LC. When you accumulate NS/LC, it gives the provider the option to review and possibly decline future appointments.   -Insurance Referral Authorization ([employer]are, [place] CCN, [employer], Designer, multimedia White)/Self-Pay (n/a, Self-Pay or Auth # & date range): n/a    CRS Provider Preference (any or name of provider(s)): Any    Referring Provider (name & specialty): Dr. Hilbert Odor ([facility])  Care Team Updated on (date): 11.29.22 - No PCP at this time. Plan is to establish care with a PCP    Diagnosis: TOC from [facility] to [facility]. Anal fistula, s/p I&D perirectal abscess, seton placement at [facility]       Verbal records history from (pt or name of family member, caregiver, etc.): patient's sister    Continuation of Care    GI Lab Testing/Procedure Reports with any Associated Pathology (FROM LAST 10 YEARS):   Colonoscopy, EGD, Flexible Sigmoidoscopy, Anorectal Manometry, Capsule Endoscopy, Endoscopic Ultrasound   Patient GI Testing History (Approx year, name of test & facility name, or provider name as needed. Or document, Pt with no memory of provider/facility name.):     No hx    Operative Report, Surgical Pathology & Discharge Summary (FROM LAST 10 YEARS):   Abdominal & Pelvic Surgeries, Colon, Rectal & Anal Surgeries (Please also note lifetime history.)  Patient Abdominal/Pelvic Surgery History (Approx year, non-clinical name of surgery & facility name, or provider name as needed. Or document, Pt with no memory of provider/facility name.):     10.25.22 - I&D perirectal abscess, complicated/multiple - genital/pelvic at [facility]    11.8.22 - I&D perirectal abscess, seton placement at [facility]     Radiology Imaging Reports & Cloud Images (FROM LAST 5 YEARS):    CT c/a/p, MRI a/p, MRE, Xray: Abdomen (KUB), Pelvis, Acute Abdominal Series, Small Bowel Follow Thru, PET Scan: Full Body or Skull to Thighs. Colon Single Contrast &/or MRI Defecography, Barium/Gastrographin Enemas, Sitz Marker Study  Patient Radiology History (List name of Hospital(s)/Facilities(s) only):     [facility] [place], [place], [place]     Pelvic Floor Physical Therapy Progress Notes or Biofeedback Testing/Pelvic EMG Testing (FROM LAST 5 YEARS).  Patient Physical Therapy History (n/a or  list timeframe w/facility name. Common diagnoses w/PT: constipation, fecal incontinence, pelvic floor dysfunction, rectocele & rectal prolapse): n/a    Gastroenterology Office Visit Notes/Progress Notes (FROM LAST 5 YEARS).  Patient Office Visit Notes/Progress Notes History: CNC/RN to review and request as needed.     Emailed Records/Referral from Physician Consult to [email or web address] (scan into O2) and corresponding CNC/RN (n/a or RN name w/date): n/a

## 2021-09-13 ENCOUNTER — Encounter: Admit: 2021-09-13 | Discharge: 2021-09-13 | Payer: MEDICARE

## 2021-09-13 DIAGNOSIS — K603 Anal fistula: Secondary | ICD-10-CM

## 2021-09-13 MED ORDER — SODIUM CHLORIDE 0.9 % IV SOLP
250 mL | INTRAVENOUS | 0 refills
Start: 2021-09-13 — End: ?

## 2021-09-13 NOTE — Patient Instructions
[facility]  Surgery Instructions  Date of Surgery: 11/02/2021     Your surgery will be held at [facility] ([address] [place], [place] [address])  Where to park on the day of surgery: In front of the building entrance.   Where to check in on the day of surgery: At the [facility] inside the building at the entrance. (DURING COVID-19 CRISIS - please stop at the COVID-19 screening checkpoint and they will direct you to where you need to check in).    Arrival Time:   The OR scheduling office must finalize the OR schedule before we can provide a time for your surgery. Please do not call your physicians office about surgery arrival time. Your surgery start time and arrive times will be called out to you 2-5 days prior to the surgery date when they call to complete their pre-screening and/or when there are changes made to the schedule of the operating room. If you have not heard what time to arrive to the facility and it is the business day before your surgery, please call [phone].  The day of the procedure, if you cannot keep your appointment, or are delayed, please contact the surgery center immediately at [phone].    Billing:  A member of the business office will verify your medical benefits with your insurance company prior to your surgery. They will make every effort to contact you regarding your financial responsibilities due at the time of registration. Any questions for the [employer], please call [phone].      COVID-19 Testing: No testing needed for planned surgery. If you develop any COVID-19 symptoms within 14 days of your procedure, please call [phone]. Ask to be connected to Einar Pheasant (Nurse for Dr. Daphine Deutscher)..     Anyone can have mild to severe symptoms. People with these symptoms may have COVID-19:    Fever or chills  Cough  Shortness of breath or difficulty breathing  Fatigue  Muscle or body aches  Headache  New loss of taste or smell  Sore throat  Congestion or runny nose  Nausea or vomiting  Diarrhea    Eating and drinking: Do not eat after 11PM the night before your surgery unless otherwise instructed. No gum, No candy or mints! Sips or small amounts of water and clear liquids may be consumed after 11pm, but must be stopped 2 hours before you need to arrive to the hospital the day of your surgery.     Bowel Prep: 2 Saline enemas back to back (meaning: place one in and immediately administer the second, so that two enemas are in your rectum at once) 1 hour before you leave your house the morning of surgery.    Hygiene:    Shower the night before and the morning of your procedure , you will not need to perform your showers with any special soaps, just be sure you clean on the day of your surgery.    Personal Items:    Do not wear makeup, fingernail polish, lotion, deodorant, or perfume  Remove all metal jewelry and piercings.   Bring a Retail buyer for your glasses, contacts, hearing aids, dentures, etc.     Thing to Bring:    Personal identification (ID)   Insurance card    Medications:    You will be given medication instructions by the Pre-Operative Assessment [facility]). If you have NOT been scheduled a Pre-Operative Assessment appointment, you will  receive a phone call to be ?triaged? by the anesthesia team. Please be sure to speak with the anesthesia team or attend your [facility] appointment if setup for you. If you do not, this may result in cancellation or delay of your surgery.  If you need to get back in touch with the Pre-Anesthesia Nurse after she complete her call with you, please contact them at [phone].     Transportation:    During the COVID-19 crisis, they will allow 1 visitor in surgical waiting room the during the time of the surgery. You are having an outpatient surgery, a responsible adult you know and trust is required to drive you home and stay with you for 12-24 hours after surgery. If you do not have this arranged prior to surgery, your surgery may be delayed or cancelled. Remember, you are not allowed to drive for 24 hours after surgery.    FMLA:   If you have any FMLA or Short Term Disability Paperwork needs, you may have these faxed to [phone]- Attention to Dr. Clover Mealy, RN. Be sure your name and birthday are on the forms, along with a return fax number for this office to return them to once completed. Please allow up to 7-10 business days for completion of paperwork.     POST OPERATIVE INSTRUCTIONS FOR ANORECTAL SURGERY    You will be undergoing an anorectal surgery.  Here are a few things to expect after your surgery:    1) Everybody has pain - this is expected.  Please be aware that in these first few weeks, your pain will likely not be a 0/10, but to find an acceptable pain tolerance level and to make this your goal for pain control. Our goal is to wean you away from the use of prescription pain medication as soon as possible after surgery. We would like to get you to the point that the only thing you need for pain management after surgery is over the counter Tylenol for mild discomforts.    Medication Recommendations for pain after anorectal surgery:    Be sure to take 1000mg  dose of Tylenol (acetaminophen) every six hours. (Remember to set an alarm or timer to go off every 6 hours to remind you to take your next dose).  If you have ever been informed by another provider not to take Tylenol, please notify your surgeon's office/nurse to discuss alternative options.    You can take your prescription pain medication (Tramadol/Oxycodone/Hydrocodone) at the same time as your Tylenol. (Please set a timer for your next prescribed interval (4 hours, 6 hours, 8 hours, 12 hours), to re-assess your pain. If you feel you need to take another dose of (Tramadol/Oxycodone/Hydrocodone) you can. HOWEVER, if your pain is well controlled with the over the counter Tylenol, do NOT take.   It can be hard to wake yourself up at night, but if any of these alarms/timers fall in the middle of the night, we recommend waking to assess your pain during the overnight hours. Pain can get away from you quickly while you sleep, and we do not want you to wake up with uncontrolled pain in the mornings. It is better to re-dose your medication in the middle of the night, if needed, rather than waking up and requiring higher doses due to increased pain.    Prescription pain medications can be constipating. Constipation can cause additional pain or recovery complications. We recommend, if you are not already doing so, to take a daily bowel regimen  to help prevent constipation (see below).    Recommended Post Operative Bowel Regimen:  Docusate Sodium (50mg  tablets) - take 1 tablet twice daily  Miralax (1 dose) daily  If you should experience diarrhea, please stop taking Miralax and continue it's use only use when needed.  DO NOT ALLOW YOURSELF TO GO 2-3 DAYS WITHOUT A BOWEL MOVEMENT. If you wait too long, the first bowel movement may be unpleasant.   If you have gone more than 2-3 days without a bowel movement, begin the use of Miralax every 2-3 hours until a bowel movement is initiated.       Other things to try to help with pain:   Sitz baths (sitting in warm water for 15-97minutes at a time), before and after bowel movements and through out the day, as needed.  Avoid wiping. Try to utilize detachable shower head, peri bottle, bidet attachement or Sitz Bath (lapping water to bottom) to clean the area after bowel movements.   Avoid using a donut pillow. This can caused increased pressure and worsen or flare hemorrhoids. Instead, use a fluffy pillow, or consider purchase of a gel cushion for your chair. If do not have a fluffy pillow or gel cushion, you can also place a dish towel under one side of your bottom, to eliminate pressure on your anus while you recover. This can be moved from side to side, as needed for discomfort.  Ice Packs - you may try using an ice pack 20 minutes at a time. This may help decrease swelling and discomfort to the area.  Recticare Cream - apply externally every 4 to 6 hours, as needed and reapply after bowel movements. Recticare Lidocaine cream can be purchased over the counter and does not require a prescription (see picture below).     2) Everybody has some spotting or mild amounts of bleeding/bloody drainage.  If you see more significant bleeding, try holding some pressure over the wound for 15-20 minutes.   If you pass more than a cup full of blood or you cannot get the bleeding to stop by holding pressure, call the office.    3) Infections rarely occur in this area after surgery.  If you see small amounts of yellowish/pus like drainage in the days following surgery, this is expected.  It is also normal to run a low grade fever (below 101.5 F) following surgery.    4) You will feel numb in the anorectal area for 4-6 hours after surgery due to the numbing medication used in the operating room.  It is possible you may experience some fecal incontinence (accidental passage of gas/stool) during this time.  The numbness will resolve on its own.  Once you feel sensation returning, you should consider taking something for pain as you can expect oral medications to take 30-60 minutes before you start feeling their effects.    5) There is generally no specific wound care necessary immediately after surgery.  Simply showering/bathing 1-2 times a day and after bowel movements is sufficient to keep the wounds clean.  You may want to keep a dry gauze/pad over the wound site as it is normal to have some amount of drainage, and this drainage can be irritating to the skin.  If you have a packing in the wound, your surgeon will give you more specific instructions on how to manage this.    6) For some operations you may have stitches in your wound.  Those stitches almost always break within a few  days of surgery.  If you feel a pop or the wound opens - this is OK.  The wound will continue to fill in on its own.  It is still ok to shower/bathe as normal.  Expect some amount of drainage if the wound is open. Normal drainage colors can consist of yellowish, tan or brownish. If the drainage from the area becomes foul smelling and/or you should run a fever of 101.5 degrees F or higher, please call the office.    7) Your only activity restrictions are no driving while you are taking narcotic pain medications.  You may want to avoid heavy lifting/strenuous exercise for the first 1-2 days after surgery, but if you feel up to resuming normal activities/exercise, this is ok.    8) Lastly, if you have any questions or concerns Fuller Song, [phone]!  If your question is one that can wait until normal business hours (M-F 8:00AM-4PM), it is better to wait until the daytime as someone more familiar with your care will be able to help you.  However, if it is an emergency, the on-call surgeon will be able to give you good advice - you can reach this by calling the main hospital at [phone] and requesting someone from surgical oncology be paged.     If you have questions, please call [phone]. Ask to be connected to Einar Pheasant (Nurse for Dr. Daphine Deutscher).    If your needs are urgent and it?s after hours   please call [phone] and have the on call provider with Surgical Oncology paged.

## 2021-09-22 ENCOUNTER — Ambulatory Visit: Admit: 2021-09-22 | Discharge: 2021-09-22 | Payer: MEDICARE

## 2021-09-22 DIAGNOSIS — K603 Anal fistula: Secondary | ICD-10-CM

## 2021-10-24 ENCOUNTER — Encounter: Admit: 2021-10-24 | Discharge: 2021-10-24 | Payer: MEDICARE

## 2021-10-24 NOTE — Telephone Encounter
Larry Hoffman called back and advised he does not want to undergo additional testing as he has already "had all of those scans". He states he is very "hard headed" about this. He mentions the band is not bothering him, and he does not desire additional workup.     I offered to schedule him a return to clinic with Dr. Daphine Deutscher to discuss further, but unfortunately, I cannot assist with rescheduling surgery per recommendations of Dr. Daphine Deutscher for completion of additional stroke workup as ordered by Dr. Herschell Dimes.     Pt accepts appointment on 2/14 at 1230. Called and LVM with appointment details on patient phone as he was driving at the time of our discussion. He has no further needs at this time.

## 2021-10-24 NOTE — Telephone Encounter
I called Larry Hoffman to follow up regarding conversation with [facility] office related to concerns of past medical history and proceeding with scheduled surgery on 11/02/2021.     Larry Hoffman did not answer, but I LVM stating that due to past stroke history, we need to cancel his 11/02/21 surgery. Dr. Daphine Deutscher has requested that patient undergo additional workup ordered by Dr. Herschell Dimes (carotid US, echo, and holter monitor). Pt will need to return to clinic pre-opertaively with Dr. 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     Pt was advised to contact Dr. Carney Living office to schedule his recommended workup and call our team back to confirm he has received notification of cancelled 2/15 surgery. Patient made aware we will cancel his Post Op visit at end of March with Larry Pelton, NP and once testing completed, we can reschedule a preoperative visit with Dr. Daphine Deutscher. Call back # provided for patient.

## 2021-10-24 NOTE — Telephone Encounter
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  I dont see that scheduled   He will be stable with the seton in place.  His prior surgeries were emergent but this is elective.  I would let him know that due to his stroke anesthesia would like him to have the tests done as ordered by Dr. Herschell Dimes.  He already had a brain MRI.  So the needed tests would be the carotid US, echo, and holter monitor.  We can see him back after those are done to review and then setup him up for the surgery.  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  The patient was supposed to follow up and have echo, carotid u/s,holter monitor, MRI of brain per orders from Dr. Herschell Dimes.  I spoke with Med records at the office and they do not show that the patient has completed these.  Dr. Val Eagle recommends this procedure be done in the hospital setting as neuro work up has not been completed.  Please let me know what you would like to do.  Thank you,  Marjean Donna

## 2021-11-02 ENCOUNTER — Encounter: Admit: 2021-11-02 | Discharge: 2021-11-02 | Payer: MEDICARE
